# Patient Record
Sex: Male | Born: 1985
Health system: Southern US, Community
[De-identification: ages and names within clinical notes are randomized; demographics above are authoritative.]

## PROBLEM LIST (undated history)

## (undated) DIAGNOSIS — R519 Headache, unspecified: Secondary | ICD-10-CM

## (undated) DIAGNOSIS — F319 Bipolar disorder, unspecified: Secondary | ICD-10-CM

## (undated) DIAGNOSIS — F32A Depression, unspecified: Secondary | ICD-10-CM

## (undated) DIAGNOSIS — R51 Headache: Secondary | ICD-10-CM

## (undated) DIAGNOSIS — F329 Major depressive disorder, single episode, unspecified: Secondary | ICD-10-CM

## (undated) DIAGNOSIS — F419 Anxiety disorder, unspecified: Secondary | ICD-10-CM

## (undated) DIAGNOSIS — Z87442 Personal history of urinary calculi: Secondary | ICD-10-CM

## (undated) HISTORY — PX: OTHER SURGICAL HISTORY: SHX169

## (undated) HISTORY — PX: FRACTURE SURGERY: SHX138

## (undated) HISTORY — PX: WISDOM TOOTH EXTRACTION: SHX21

---

## 2010-07-17 ENCOUNTER — Emergency Department (HOSPITAL_COMMUNITY): Admission: EM | Admit: 2010-07-17 | Discharge: 2010-07-17 | Payer: Self-pay | Admitting: Emergency Medicine

## 2010-08-01 ENCOUNTER — Emergency Department (HOSPITAL_COMMUNITY): Admission: EM | Admit: 2010-08-01 | Discharge: 2010-08-01 | Payer: Self-pay | Admitting: Emergency Medicine

## 2011-02-19 LAB — DIFFERENTIAL
Basophils Absolute: 0 10*3/uL (ref 0.0–0.1)
Basophils Relative: 0 % (ref 0–1)
Eosinophils Absolute: 0.2 10*3/uL (ref 0.0–0.7)
Eosinophils Relative: 1 % (ref 0–5)
Lymphocytes Relative: 14 % (ref 12–46)
Lymphs Abs: 2.1 10*3/uL (ref 0.7–4.0)
Monocytes Absolute: 1.4 10*3/uL — ABNORMAL HIGH (ref 0.1–1.0)
Neutrophils Relative %: 76 % (ref 43–77)

## 2011-02-19 LAB — CBC
HCT: 42.8 % (ref 39.0–52.0)
Hemoglobin: 14.8 g/dL (ref 13.0–17.0)
MCH: 29.3 pg (ref 26.0–34.0)
Platelets: 246 10*3/uL (ref 150–400)
RBC: 5.05 MIL/uL (ref 4.22–5.81)

## 2011-02-19 LAB — POCT I-STAT, CHEM 8
BUN: 12 mg/dL (ref 6–23)
Creatinine, Ser: 0.8 mg/dL (ref 0.4–1.5)
HCT: 46 % (ref 39.0–52.0)
Potassium: 3.9 mEq/L (ref 3.5–5.1)
Sodium: 139 mEq/L (ref 135–145)
TCO2: 27 mmol/L (ref 0–100)

## 2011-04-29 ENCOUNTER — Emergency Department (HOSPITAL_COMMUNITY)
Admission: EM | Admit: 2011-04-29 | Discharge: 2011-04-30 | Disposition: A | Discharge status: Home / Self Care | Payer: Self-pay | Attending: Emergency Medicine | Admitting: Emergency Medicine

## 2011-04-29 DIAGNOSIS — X58XXXA Exposure to other specified factors, initial encounter: Secondary | ICD-10-CM | POA: Insufficient documentation

## 2011-04-29 DIAGNOSIS — L089 Local infection of the skin and subcutaneous tissue, unspecified: Secondary | ICD-10-CM | POA: Insufficient documentation

## 2011-04-29 DIAGNOSIS — IMO0002 Reserved for concepts with insufficient information to code with codable children: Secondary | ICD-10-CM | POA: Insufficient documentation

## 2011-06-24 ENCOUNTER — Emergency Department (HOSPITAL_COMMUNITY): Payer: Self-pay

## 2011-06-24 ENCOUNTER — Emergency Department (HOSPITAL_COMMUNITY)
Admission: EM | Admit: 2011-06-24 | Discharge: 2011-06-24 | Disposition: A | Discharge status: Home / Self Care | Payer: Self-pay | Attending: Emergency Medicine | Admitting: Emergency Medicine

## 2011-06-24 DIAGNOSIS — M25529 Pain in unspecified elbow: Secondary | ICD-10-CM | POA: Insufficient documentation

## 2011-06-24 DIAGNOSIS — M6580 Other synovitis and tenosynovitis, unspecified site: Secondary | ICD-10-CM | POA: Insufficient documentation

## 2011-06-24 DIAGNOSIS — M25429 Effusion, unspecified elbow: Secondary | ICD-10-CM | POA: Insufficient documentation

## 2011-06-24 DIAGNOSIS — R209 Unspecified disturbances of skin sensation: Secondary | ICD-10-CM | POA: Insufficient documentation

## 2014-02-07 ENCOUNTER — Emergency Department (HOSPITAL_COMMUNITY): Payer: 59

## 2014-02-07 ENCOUNTER — Emergency Department (HOSPITAL_COMMUNITY)
Admission: EM | Admit: 2014-02-07 | Discharge: 2014-02-08 | Disposition: A | Discharge status: Home / Self Care | Payer: 59 | Attending: Emergency Medicine | Admitting: Emergency Medicine

## 2014-02-07 ENCOUNTER — Encounter (HOSPITAL_COMMUNITY): Payer: Self-pay | Admitting: Emergency Medicine

## 2014-02-07 DIAGNOSIS — F17200 Nicotine dependence, unspecified, uncomplicated: Secondary | ICD-10-CM | POA: Insufficient documentation

## 2014-02-07 DIAGNOSIS — IMO0002 Reserved for concepts with insufficient information to code with codable children: Secondary | ICD-10-CM | POA: Insufficient documentation

## 2014-02-07 DIAGNOSIS — L0390 Cellulitis, unspecified: Secondary | ICD-10-CM

## 2014-02-07 LAB — CBC WITH DIFFERENTIAL/PLATELET
BASOS ABS: 0 10*3/uL (ref 0.0–0.1)
Basophils Relative: 0 % (ref 0–1)
Eosinophils Absolute: 0.2 10*3/uL (ref 0.0–0.7)
Eosinophils Relative: 3 % (ref 0–5)
HEMATOCRIT: 43.4 % (ref 39.0–52.0)
HEMOGLOBIN: 15.5 g/dL (ref 13.0–17.0)
LYMPHS ABS: 2.8 10*3/uL (ref 0.7–4.0)
LYMPHS PCT: 37 % (ref 12–46)
MCH: 30 pg (ref 26.0–34.0)
MCHC: 35.7 g/dL (ref 30.0–36.0)
MCV: 84.1 fL (ref 78.0–100.0)
MONOS PCT: 9 % (ref 3–12)
Monocytes Absolute: 0.7 10*3/uL (ref 0.1–1.0)
NEUTROS ABS: 3.8 10*3/uL (ref 1.7–7.7)
NEUTROS PCT: 51 % (ref 43–77)
PLATELETS: 242 10*3/uL (ref 150–400)
RBC: 5.16 MIL/uL (ref 4.22–5.81)
RDW: 11.8 % (ref 11.5–15.5)
WBC: 7.4 10*3/uL (ref 4.0–10.5)

## 2014-02-07 MED ORDER — ONDANSETRON HCL 4 MG/2ML IJ SOLN
4.0000 mg | Freq: Once | INTRAMUSCULAR | Status: AC
  Administered 2014-02-08: 4 mg via INTRAVENOUS
  Filled 2014-02-07: qty 2

## 2014-02-07 MED ORDER — MORPHINE SULFATE 4 MG/ML IJ SOLN
4.0000 mg | Freq: Once | INTRAMUSCULAR | Status: AC
  Administered 2014-02-08: 4 mg via INTRAVENOUS
  Filled 2014-02-07: qty 1

## 2014-02-07 NOTE — ED Provider Notes (Signed)
CSN: [id number]     Arrival date & time 02/07/14  2114 History  This chart was scribed for non-physician practitioner Emilia BeckKaitlyn Jenna Routzahn, PA-C working with Layla MawKristen N Ward, DO by Joaquin MusicKristina Sanchez-Matthews, ED Scribe. This patient was seen in room TR08C/TR08C and the patient's care was started at 11:34 PM .   Chief Complaint  Patient presents with  . Wrist Pain   The history is provided by the patient. No language interpreter was used.   HPI Comments: Tyler Robertson is a 28 y.o. male who presents to the Emergency Department complaining of ongoing R wrist pain that worsened this evening at work. 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 Pt states he has pain with movement and states R wrist has been swelling. He states he had an infection to R arm 3 years ago and states he had emergency surgery done where antibiotic beads were placed in his humerus. Pt states [facility]. Pt denies any recent injuries.  History reviewed. No pertinent past medical history. Past Surgical History  Procedure Laterality Date  . Fracture surgery     History reviewed. No pertinent family history. History  Substance Use Topics  . Smoking status: Never Smoker   . Smokeless tobacco: Current User    Types: Chew  . Alcohol Use: No    Review of Systems  Musculoskeletal: Positive for joint swelling.  Skin: Positive for color change.  All other systems reviewed and are negative.   Allergies  Review of patient's allergies indicates no known allergies.  Home Medications  No current outpatient prescriptions on file.  BP 150/94  Pulse 80  Temp(Src) 98.3 F (36.8 C) (Oral)  Resp 16  Wt 184 lb 5 oz (83.604 kg)  SpO2 98%  Physical Exam  Nursing note and vitals reviewed. Constitutional: He is oriented to person, place, and time. He appears well-developed and well-nourished. No distress.  HENT:  Head: Normocephalic  and atraumatic.  Eyes: EOM are normal.  Neck: Neck supple. No tracheal deviation present.  Cardiovascular: Normal rate.   Pulmonary/Chest: Effort normal. No respiratory distress.  Musculoskeletal:  Right wrist limited ROM due to pain and swelling. No obvious deformity.   Neurological: He is alert and oriented to person, place, and time.  Skin: Skin is warm and dry.  Erythema and edema noted to dorsal right wrist. No open wound.   Psychiatric: He has a normal mood and affect. His behavior is normal.    ED Course  Procedures  DIAGNOSTIC STUDIES: Oxygen Saturation is 98% on RA, normal by my interpretation.    COORDINATION OF CARE: 11:40 PM-Discussed treatment plan which includes wait for radiology readings and order labs. Pt agreed to plan.   Labs Review Labs Reviewed  CBC WITH DIFFERENTIAL  BASIC METABOLIC PANEL   Imaging Review Dg Wrist Complete Right  02/07/2014   CLINICAL DATA:  Wrist pain with cellulitis. Evaluate for osteomyelitis  EXAM: RIGHT WRIST - COMPLETE 3+ VIEW  COMPARISON:  None.  FINDINGS: There is postsurgical or posttraumatic deformity of the forearm. There are no areas of bone erosion or focal osteopenia within the wrist. The carpal joints are symmetric throughout. No evidence of fracture.  IMPRESSION: No evidence of wrist osteomyelitis.   Electronically Signed   By: Tiburcio PeaJonathan  Watts M.D.   On: 02/07/2014 23:41     EKG Interpretation None     MDM  Final diagnoses:  Cellulitis    1:49 AM Labs and xray unremarkable for acute changes. Patient likely has cellulitis. Patient received IV clindamycin here. Patient will be discharged with Keflex and Bactrim and Vicodin for pain. Patient advised to return with worsening or concerning symptoms. Vitals stable and patient afebrile.   I personally performed the services described in this documentation, which was scribed in my presence. The recorded information has been reviewed and is accurate.    Emilia Beck,  [place] 02/08/14 [phone]

## 2014-02-07 NOTE — ED Notes (Signed)
Presents with right wrist pain and induration. Reports decrease in strength in hand and pain in wrist and hand.  HX of multiple fractures and surgeries in left arm.  Not warm to touch, soft, CMS intact. Denies injury.

## 2014-02-08 LAB — BASIC METABOLIC PANEL
BUN: 12 mg/dL (ref 6–23)
CO2: 25 meq/L (ref 19–32)
Calcium: 9.4 mg/dL (ref 8.4–10.5)
Chloride: 102 mEq/L (ref 96–112)
Creatinine, Ser: 0.96 mg/dL (ref 0.50–1.35)
GFR calc Af Amer: >90 mL/min (ref >90)
GFR calc non Af Amer: >90 mL/min (ref >90)
GLUCOSE: 89 mg/dL (ref 70–99)
POTASSIUM: 4.2 meq/L (ref 3.7–5.3)
Sodium: 140 mEq/L (ref 137–147)

## 2014-02-08 MED ORDER — HYDROCODONE-ACETAMINOPHEN 5-325 MG PO TABS: 2.0000 | ORAL_TABLET | ORAL | Status: DC | PRN

## 2014-02-08 MED ORDER — CEPHALEXIN 500 MG PO CAPS: 500.0000 mg | ORAL_CAPSULE | Freq: Four times a day (QID) | ORAL | Status: DC

## 2014-02-08 MED ORDER — CLINDAMYCIN PHOSPHATE 900 MG/50ML IV SOLN
900.0000 mg | Freq: Once | INTRAVENOUS | Status: AC
  Administered 2014-02-08: 900 mg via INTRAVENOUS
  Filled 2014-02-08: qty 50

## 2014-02-08 MED ORDER — SULFAMETHOXAZOLE-TRIMETHOPRIM 800-160 MG PO TABS: 1.0000 | ORAL_TABLET | Freq: Two times a day (BID) | ORAL | Status: DC

## 2014-02-08 NOTE — ED Notes (Signed)
Family at bedside, patient comfortable.

## 2014-02-08 NOTE — ED Notes (Signed)
Patient is alert and orientedx4.  Patient was explained discharge instructions and they understood them with no questions.  The patient's wife, Letta KocherRenee Tay is taking the patient home.

## 2014-02-08 NOTE — Discharge Instructions (Signed)
Take Keflex and Bactrim as directed until gone. Take Vicodin as needed for pain. Refer to attached documents for more information. Return to the ED with worsening or concerning symptoms.

## 2014-02-08 NOTE — ED Provider Notes (Signed)
Medical screening examination/treatment/procedure(s) were performed by non-physician practitioner and as supervising physician I was immediately available for consultation/collaboration.   EKG Interpretation None        Reon Hunley, MD 02/08/14 0607 

## 2014-02-08 NOTE — ED Notes (Signed)
Family at bedside. 

## 2014-03-20 ENCOUNTER — Encounter (HOSPITAL_COMMUNITY): Payer: Self-pay | Admitting: Emergency Medicine

## 2014-03-20 ENCOUNTER — Emergency Department (HOSPITAL_COMMUNITY)
Admission: EM | Admit: 2014-03-20 | Discharge: 2014-03-20 | Disposition: A | Discharge status: Home / Self Care | Payer: 59 | Attending: Emergency Medicine | Admitting: Emergency Medicine

## 2014-03-20 ENCOUNTER — Emergency Department (HOSPITAL_COMMUNITY): Payer: 59

## 2014-03-20 DIAGNOSIS — R51 Headache: Secondary | ICD-10-CM | POA: Insufficient documentation

## 2014-03-20 DIAGNOSIS — R519 Headache, unspecified: Secondary | ICD-10-CM

## 2014-03-20 MED ORDER — METOCLOPRAMIDE HCL 5 MG/ML IJ SOLN
10.0000 mg | Freq: Once | INTRAMUSCULAR | Status: AC
  Administered 2014-03-20: 10 mg via INTRAVENOUS
  Filled 2014-03-20: qty 2

## 2014-03-20 MED ORDER — DIPHENHYDRAMINE HCL 50 MG/ML IJ SOLN
25.0000 mg | Freq: Once | INTRAMUSCULAR | Status: AC
  Administered 2014-03-20: 25 mg via INTRAVENOUS
  Filled 2014-03-20: qty 1

## 2014-03-20 MED ORDER — DEXAMETHASONE SODIUM PHOSPHATE 4 MG/ML IJ SOLN
8.0000 mg | Freq: Once | INTRAMUSCULAR | Status: AC
  Administered 2014-03-20: 8 mg via INTRAVENOUS
  Filled 2014-03-20: qty 2

## 2014-03-20 MED ORDER — SODIUM CHLORIDE 0.9 % IV BOLUS (SEPSIS)
1000.0000 mL | Freq: Once | INTRAVENOUS | Status: AC
  Administered 2014-03-20: 1000 mL via INTRAVENOUS

## 2014-03-20 NOTE — Discharge Instructions (Signed)
Sinus Headache °A sinus headache is when your sinuses become clogged or swollen. Sinus headaches can range from mild to severe.  °CAUSES °A sinus headache can have different causes, such as: °· Colds. °· Sinus infections. °· Allergies. °SYMPTOMS  °Symptoms of a sinus headache may vary and can include: °· Headache. °· Pain or pressure in the face. °· Congested or runny nose. °· Fever. °· Inability to smell. °· Pain in upper teeth. °Weather changes can make symptoms worse. °TREATMENT  °The treatment of a sinus headache depends on the cause. °· Sinus pain caused by a sinus infection may be treated with antibiotic medicine. °· Sinus pain caused by allergies may be helped by allergy medicines (antihistamines) and medicated nasal sprays. °· Sinus pain caused by congestion may be helped by flushing the nose and sinuses with saline solution. °HOME CARE INSTRUCTIONS  °· If antibiotics are prescribed, take them as directed. Finish them even if you start to feel better. °· Only take over-the-counter or prescription medicines for pain, discomfort, or fever as directed by your caregiver. °· If you have congestion, use a nasal spray to help reduce pressure. °SEEK IMMEDIATE MEDICAL CARE IF: °· You have a fever. °· You have headaches more than once a week. °· You have sensitivity to light or sound. °· You have repeated nausea and vomiting. °· You have vision problems. °· You have sudden, severe pain in your face or head. °· You have a seizure. °· You are confused. °· Your sinus headaches do not get better after treatment. Many people think they have a sinus headache when they actually have migraines or tension headaches. °MAKE SURE YOU:  °· Understand these instructions. °· Will watch your condition. °· Will get help right away if you are not doing well or get worse. °Document Released: 12/30/2004 Document Revised: 02/14/2012 Document Reviewed: 02/20/2011 °Exit[employer]® Patient Information ©2014 [employer]. ° °

## 2014-03-20 NOTE — ED Notes (Signed)
Pt states he has been having headaches off and on for over 3 months, wife was rubbing his head last night and felt a lump on the back of his head last night. Pt denies any injury or fall.

## 2014-03-20 NOTE — ED Notes (Signed)
Patient transported to CT 

## 2014-03-20 NOTE — ED Notes (Signed)
MD at bedside. 

## 2014-03-20 NOTE — ED Provider Notes (Signed)
CSN: [id number]     Arrival date & time 03/20/14  1412 History   First MD Initiated Contact with Patient 03/20/14 1606     Chief Complaint  Patient presents with  . Headache     (Consider location/radiation/quality/duration/timing/severity/associated sxs/prior Treatment) HPI  This is a 28 y.o. male with no pertinent PMH presenting today with HA.  Onset about 2 months ago.  Located diffusely.  Waxes and wanes.  Occurs about 1-2 times a day.  Severe.  Throbbing.  No alleviation with tylenol, vicodin.  Aggravated by loud noises, bright lights.  Radiates throughout head.  Negative for fever, trauma, bleeding.  Pt is UTD on vaccines.  Positive for lump in the right occipital head which was noticed last night.    History reviewed. No pertinent past medical history. Past Surgical History  Procedure Laterality Date  . Fracture surgery     No family history on file. History  Substance Use Topics  . Smoking status: Never Smoker   . Smokeless tobacco: Current User    Types: Chew  . Alcohol Use: No    Review of Systems  Constitutional: Negative for fever and chills.  HENT: Negative for facial swelling.   Eyes: Negative for pain and visual disturbance.  Respiratory: Negative for chest tightness and shortness of breath.   Cardiovascular: Negative for chest pain.  Gastrointestinal: Negative for nausea and vomiting.  Genitourinary: Negative for dysuria.  Musculoskeletal: Negative for arthralgias and myalgias.  Neurological: Positive for headaches.  Psychiatric/Behavioral: Negative for behavioral problems.      Allergies  Review of patient's allergies indicates no known allergies.  Home Medications   Prior to Admission medications   Medication Sig Start Date End Date Taking? Authorizing Provider  cephALEXin (KEFLEX) 500 MG capsule Take 1 capsule (500 mg total) by mouth 4 (four) times daily. 02/08/14   Emilia BeckKaitlyn Szekalski, PA-C  HYDROcodone-acetaminophen (NORCO/VICODIN) 5-325 MG per  tablet Take 2 tablets by mouth every 4 (four) hours as needed. 02/08/14   Kaitlyn Szekalski, PA-C  hydrocodone-ibuprofen (VICOPROFEN) 5-200 MG per tablet Take 1 tablet by mouth every 8 (eight) hours as needed for pain.    Historical Provider, MD  sulfamethoxazole-trimethoprim (SEPTRA DS) 800-160 MG per tablet Take 1 tablet by mouth every 12 (twelve) hours. 02/08/14   Kaitlyn Szekalski, PA-C   BP 146/97  Pulse 61  Temp(Src) 97.9 F (36.6 C) (Oral)  Ht 5\' 3"  (1.6 m)  Wt 184 lb 14.4 oz (83.87 kg)  BMI 32.76 kg/m2  SpO2 98% Physical Exam  Constitutional: He is oriented to person, place, and time. He appears well-developed and well-nourished. No distress.  HENT:  Head: Normocephalic and atraumatic.  Mouth/Throat: No oropharyngeal exudate.  Eyes: Conjunctivae are normal. Pupils are equal, round, and reactive to light. No scleral icterus.  Neck: Normal range of motion. No tracheal deviation present. No thyromegaly present.  Cardiovascular: Normal rate, regular rhythm and normal heart sounds.  Exam reveals no gallop and no friction rub.   No murmur heard. Pulmonary/Chest: Effort normal and breath sounds normal. No stridor. No respiratory distress. He has no wheezes. He has no rales. He exhibits no tenderness.  Abdominal: Soft. He exhibits no distension and no mass. There is no tenderness. There is no rebound and no guarding.  Musculoskeletal: Normal range of motion. He exhibits no edema.  Neurological: He is alert and oriented to person, place, and time. He has normal strength. No cranial nerve deficit or sensory deficit. He displays a negative Romberg sign. Coordination and gait  normal. GCS eye subscore is 4. GCS verbal subscore is 5. GCS motor subscore is 6.  Reflex Scores:      Patellar reflexes are 2+ on the right side and 2+ on the left side. Skin: Skin is warm and dry. He is not diaphoretic.    ED Course  Procedures (including critical care time) Labs Review Labs Reviewed - No data to  display  Imaging Review No results found.   EKG Interpretation None      MDM   Final diagnoses:  None    This is a 28 y.o. male with no pertinent PMH presenting today with HA.  Onset about 2 months ago.  Located diffusely.  Waxes and wanes.  Occurs about 1-2 times a day.  Severe.  Throbbing.  No alleviation with tylenol, vicodin.  Aggravated by loud noises, bright lights.  Radiates throughout head.  Negative for fever, trauma, bleeding.  Pt is UTD on vaccines.  Positive for lump in the right occipital head which was noticed last night.    Exam reveals no neuro deficits.  I do appreciate right occipital lump.    Considering age, concern for lump in the right occipital area, presentation of severe headache for 2 months, I ordered CT scan of the patient's head. I have ordered a bolus of normal saline, Reglan, Benadryl, Decadron. Will followup results and for a CT scan and monitor closely, reevaluate.  CT scan reveals no acute intracranial abnormality. Patient's symptoms have resolved with medication. At this time, I doubt meningitis, thrombosis, intracranial hemorrhage, cluster headache, glaucoma, or any other concerning etiology. He has no symptoms of stroke.  Currently asymptomatic.  Pt stable for discharge, FU.  All questions answered.  Return precautions given.  I have discussed case and care has been guided by my attending physician, Dr. Juleen ChinaKohut.  Loma BostonStirling Fiore Detjen, MD 03/21/14 [phone]

## 2014-03-25 NOTE — ED Provider Notes (Signed)
I saw and evaluated the patient, reviewed the resident's note and I agree with the findings and plan.   EKG Interpretation None      27yM with HA. Suspect primary HA. Consider emergent secondary causes such as bleed, infectious or mass but doubt. There is no history of trauma. Pt has a nonfocal neurological exam. Afebrile and neck supple. No use of blood thinning medication. Consider ocular etiology such as acute angle closure glaucoma but doubt. Pt denies acute change in visual acuity and eye exam unremarkable.  Doubt CO poisoning. No contacts with similar symptoms. Doubt venous thrombosis. Doubt carotid or vertebral arteries dissection. Symptoms improved with meds. No significant "red flags" identified aside from duration of HA, and because of this, CT done. Neg.  Feel that can be safely discharged, but strict return precautions discussed. Outpt fu.   Raeford RazorStephen Rhyder Koegel, MD 03/25/14 [phone]

## 2014-09-23 ENCOUNTER — Emergency Department (HOSPITAL_COMMUNITY)
Admission: EM | Admit: 2014-09-23 | Discharge: 2014-09-24 | Disposition: A | Discharge status: Home / Self Care | Payer: 59 | Attending: Emergency Medicine | Admitting: Emergency Medicine

## 2014-09-23 ENCOUNTER — Emergency Department (HOSPITAL_COMMUNITY): Payer: 59

## 2014-09-23 ENCOUNTER — Encounter (HOSPITAL_COMMUNITY): Payer: Self-pay | Admitting: Emergency Medicine

## 2014-09-23 DIAGNOSIS — R109 Unspecified abdominal pain: Secondary | ICD-10-CM

## 2014-09-23 DIAGNOSIS — N390 Urinary tract infection, site not specified: Secondary | ICD-10-CM

## 2014-09-23 LAB — COMPREHENSIVE METABOLIC PANEL
ALT: 44 U/L (ref 0–53)
AST: 42 U/L — ABNORMAL HIGH (ref 0–37)
Albumin: 4.2 g/dL (ref 3.5–5.2)
Alkaline Phosphatase: 76 U/L (ref 39–117)
Anion gap: 16 — ABNORMAL HIGH (ref 5–15)
BILIRUBIN TOTAL: 0.5 mg/dL (ref 0.3–1.2)
BUN: 11 mg/dL (ref 6–23)
CO2: 22 mEq/L (ref 19–32)
CREATININE: 1.05 mg/dL (ref 0.50–1.35)
Calcium: 9.4 mg/dL (ref 8.4–10.5)
Chloride: 94 mEq/L — ABNORMAL LOW (ref 96–112)
GLUCOSE: 106 mg/dL — AB (ref 70–99)
Potassium: 3.9 mEq/L (ref 3.7–5.3)
SODIUM: 132 meq/L — AB (ref 137–147)
TOTAL PROTEIN: 8.2 g/dL (ref 6.0–8.3)

## 2014-09-23 LAB — CBC WITH DIFFERENTIAL/PLATELET
Basophils Absolute: 0 10*3/uL (ref 0.0–0.1)
Basophils Relative: 0 % (ref 0–1)
EOS ABS: 0 10*3/uL (ref 0.0–0.7)
Eosinophils Relative: 0 % (ref 0–5)
HCT: 44.2 % (ref 39.0–52.0)
Hemoglobin: 15.6 g/dL (ref 13.0–17.0)
LYMPHS ABS: 0.8 10*3/uL (ref 0.7–4.0)
LYMPHS PCT: 7 % — AB (ref 12–46)
MCH: 29.5 pg (ref 26.0–34.0)
MCHC: 35.3 g/dL (ref 30.0–36.0)
MCV: 83.7 fL (ref 78.0–100.0)
MONO ABS: 0.7 10*3/uL (ref 0.1–1.0)
MONOS PCT: 7 % (ref 3–12)
NEUTROS ABS: 9.4 10*3/uL — AB (ref 1.7–7.7)
Neutrophils Relative %: 86 % — ABNORMAL HIGH (ref 43–77)
PLATELETS: 201 10*3/uL (ref 150–400)
RBC: 5.28 MIL/uL (ref 4.22–5.81)
RDW: 12 % (ref 11.5–15.5)
WBC: 10.9 10*3/uL — ABNORMAL HIGH (ref 4.0–10.5)

## 2014-09-23 LAB — URINE MICROSCOPIC-ADD ON

## 2014-09-23 LAB — URINALYSIS, ROUTINE W REFLEX MICROSCOPIC
BILIRUBIN URINE: NEGATIVE
GLUCOSE, UA: NEGATIVE mg/dL
KETONES UR: NEGATIVE mg/dL
Nitrite: NEGATIVE
PH: 6.5 (ref 5.0–8.0)
Protein, ur: NEGATIVE mg/dL
SPECIFIC GRAVITY, URINE: 1.014 (ref 1.005–1.030)
UROBILINOGEN UA: 0.2 mg/dL (ref 0.0–1.0)

## 2014-09-23 MED ORDER — OXYCODONE-ACETAMINOPHEN 5-325 MG PO TABS
1.0000 | ORAL_TABLET | Freq: Once | ORAL | Status: AC
  Administered 2014-09-23: 1 via ORAL
  Filled 2014-09-23: qty 1

## 2014-09-23 MED ORDER — SODIUM CHLORIDE 0.9 % IV BOLUS (SEPSIS)
1000.0000 mL | Freq: Once | INTRAVENOUS | Status: AC
  Administered 2014-09-23: 1000 mL via INTRAVENOUS

## 2014-09-23 MED ORDER — HYDROMORPHONE HCL 1 MG/ML IJ SOLN
1.0000 mg | Freq: Once | INTRAMUSCULAR | Status: AC
  Administered 2014-09-23: 1 mg via INTRAVENOUS
  Filled 2014-09-23: qty 1

## 2014-09-23 NOTE — ED Notes (Signed)
Pt in c/o left flank pain that started this morning, symptoms have gotten progressively worse, no history of kidney stones, pt had a fever PTA and took tylenol, reports urinary frequency and states he is having trouble emptying his bladder

## 2014-09-23 NOTE — ED Notes (Signed)
Pt to CT at this time.

## 2014-09-24 MED ORDER — KETOROLAC TROMETHAMINE 30 MG/ML IJ SOLN
30.0000 mg | Freq: Once | INTRAMUSCULAR | Status: AC
  Administered 2014-09-24: 30 mg via INTRAVENOUS
  Filled 2014-09-24: qty 1

## 2014-09-24 MED ORDER — HYDROCODONE-ACETAMINOPHEN 5-325 MG PO TABS: 1.0000 | ORAL_TABLET | Freq: Four times a day (QID) | ORAL | Status: DC | PRN

## 2014-09-24 MED ORDER — CEPHALEXIN 500 MG PO CAPS: 1000.0000 mg | ORAL_CAPSULE | Freq: Two times a day (BID) | ORAL | Status: DC

## 2014-09-24 MED ORDER — DEXTROSE 5 % IV SOLN
1.0000 g | Freq: Once | INTRAVENOUS | Status: AC
  Administered 2014-09-24: 1 g via INTRAVENOUS
  Filled 2014-09-24: qty 10

## 2014-09-24 MED ORDER — PROMETHAZINE HCL 25 MG PO TABS: 25.0000 mg | ORAL_TABLET | Freq: Three times a day (TID) | ORAL | Status: DC | PRN

## 2014-09-24 NOTE — ED Provider Notes (Signed)
Medical screening examination/treatment/procedure(s) were performed by non-physician practitioner and as supervising physician I was immediately available for consultation/collaboration.   EKG Interpretation None       Juliet RudeNathan R. Rubin PayorPickering, MD 09/24/14 [phone]

## 2014-09-24 NOTE — ED Provider Notes (Signed)
CSN: [id number]     Arrival date & time 09/23/14  1837 History   First MD Initiated Contact with Patient 09/23/14 2105     Chief Complaint  Patient presents with  . Flank Pain     (Consider location/radiation/quality/duration/timing/severity/associated sxs/prior Treatment) HPI Patient presents to the emergency department with left flank pain that started this morning.  The patient, states, that the symptoms get worse over the day.  The patient, states, that he's had some urinary frequency and trouble with fully emptying his bladder.  Patient, states pain radiates from his flank down to his left groin.  Patient denies a history of kidney stone.  The patient, states she seen blood in his urine.  Patient sent here by an urgent care center for further evaluation and care.  Patient, states, that nothing seems to make his condition, better or worse.  The patient did not take any medications prior to arrival.  Patient, states, that he doesn't have any chest pain, shortness of breath, headache, blurred vision, weakness, numbness, dizziness, back pain, neck pain, nausea, vomiting, or diarrhea.  Patient also denies syncope, or rash.  The patient, states, that he has had chills throughout the day. History reviewed. No pertinent past medical history. Past Surgical History  Procedure Laterality Date  . Fracture surgery     History reviewed. No pertinent family history. History  Substance Use Topics  . Smoking status: Never Smoker   . Smokeless tobacco: Current User    Types: Chew  . Alcohol Use: No    Review of Systems All other systems negative except as documented in the HPI. All pertinent positives and negatives as reviewed in the HPI.   Allergies  Review of patient's allergies indicates no known allergies.  Home Medications   Prior to Admission medications   Medication Sig Start Date End Date Taking? Authorizing Provider  Phenylephrine-DM-GG-APAP (TYLENOL COLD/FLU SEVERE) 5-10-200-325 MG  TABS Take 1 tablet by mouth daily as needed (minor pain).   Yes Historical Provider, MD  cephALEXin (KEFLEX) 500 MG capsule Take 2 capsules (1,000 mg total) by mouth 2 (two) times daily. 09/24/14   Carlyle Dollyhristopher W Jody Silas, PA-C  HYDROcodone-acetaminophen (NORCO/VICODIN) 5-325 MG per tablet Take 1 tablet by mouth every 6 (six) hours as needed for moderate pain. 09/24/14   Jamesetta Orleanshristopher W Rashaunda Rahl, PA-C  promethazine (PHENERGAN) 25 MG tablet Take 1 tablet (25 mg total) by mouth every 8 (eight) hours as needed for nausea or vomiting. 09/24/14   Jamesetta Orleanshristopher W Alston Berrie, PA-C   BP 120/73  Pulse 84  Temp(Src) 99.5 F (37.5 C) (Oral)  Resp 20  SpO2 94% Physical Exam  Nursing note and vitals reviewed. Constitutional: He is oriented to person, place, and time. He appears well-developed and well-nourished. No distress.  HENT:  Head: Normocephalic and atraumatic.  Mouth/Throat: Oropharynx is clear and moist.  Eyes: Pupils are equal, round, and reactive to light.  Neck: Normal range of motion. Neck supple.  Cardiovascular: Normal rate, regular rhythm and normal heart sounds.  Exam reveals no gallop and no friction rub.   No murmur heard. Pulmonary/Chest: Effort normal and breath sounds normal. No respiratory distress.  Abdominal: Soft. Bowel sounds are normal. He exhibits no distension. There is no tenderness.  Neurological: He is alert and oriented to person, place, and time. He exhibits normal muscle tone. Coordination normal.  Skin: Skin is warm and dry. No rash noted. No erythema.    ED Course  Procedures (including critical care time) Labs Review Labs Reviewed  URINALYSIS, ROUTINE W REFLEX MICROSCOPIC - Abnormal; Notable for the following:    APPearance CLOUDY (*)    Hgb urine dipstick LARGE (*)    Leukocytes, UA LARGE (*)    All other components within normal limits  CBC WITH DIFFERENTIAL - Abnormal; Notable for the following:    WBC 10.9 (*)    Neutrophils Relative % 86 (*)    Neutro Abs 9.4  (*)    Lymphocytes Relative 7 (*)    All other components within normal limits  COMPREHENSIVE METABOLIC PANEL - Abnormal; Notable for the following:    Sodium 132 (*)    Chloride 94 (*)    Glucose, Bld 106 (*)    AST 42 (*)    Anion gap 16 (*)    All other components within normal limits  URINE MICROSCOPIC-ADD ON - Abnormal; Notable for the following:    Bacteria, UA FEW (*)    All other components within normal limits  URINE CULTURE    Imaging Review Ct Abdomen Pelvis Wo Contrast  09/23/2014   CLINICAL DATA:  Acute left flank pain.  EXAM: CT ABDOMEN AND PELVIS WITHOUT CONTRAST  TECHNIQUE: Multidetector CT imaging of the abdomen and pelvis was performed following the standard protocol without IV contrast.  COMPARISON:  None.  FINDINGS: Visualized lung bases appear normal. No significant osseous abnormality is noted.  No gallstones are noted. No focal abnormality is noted in the liver, spleen or pancreas on these unenhanced images. Adrenal glands appear normal. No hydronephrosis or renal obstruction is noted. No renal or ureteral calculi are noted. The appendix appears normal. There is no evidence of bowel obstruction. No abnormal fluid collection is noted. Urinary bladder appears normal. No significant adenopathy is noted. Small fat containing right inguinal hernia is noted.  IMPRESSION: Small fat containing right inguinal hernia.  No hydronephrosis or renal obstruction is noted. No renal or ureteral calculi are noted.   Electronically Signed   By: Roque LiasJames  Green M.D.   On: 09/23/2014 22:10      MDM   Final diagnoses:  Flank pain  UTI (lower urinary tract infection)   She'll be treated for possible UTI with his symptoms and low-grade fever.  Patient is given antibiotics.  Told to return here for any worsening in his condition.  He'll followup with urology as they're still possibility for undetected stone versus a kidney stone that has passed.  Patient has received IV fluids and is stable  here in the emergency room     Carlyle DollyChristopher W Alitzel Cookson, PA-C 09/24/14 [phone]

## 2014-09-24 NOTE — Discharge Instructions (Signed)
Return here for any worsening in your condition.  Followup with the urologist provided.  Increase your fluid intake, Tylenol and Motrin for any fever

## 2014-09-26 LAB — URINE CULTURE: Colony Count: 75000

## 2014-09-28 ENCOUNTER — Telehealth (HOSPITAL_BASED_OUTPATIENT_CLINIC_OR_DEPARTMENT_OTHER): Payer: Self-pay | Admitting: Emergency Medicine

## 2014-09-28 NOTE — Telephone Encounter (Signed)
Post ED Visit - Positive Culture Follow-up  Culture report reviewed by antimicrobial stewardship pharmacist: [x]  Wes Dulaney, Pharm.D., BCPS []  Celedonio MiyamotoJeremy Frens, Pharm.D., BCPS []  Georgina PillionElizabeth Martin, [address] BCPS []  [place] CityMinh Pham, [address]Ph[address] BCPS, AAHIVP []  Estella HuskMichelle Turner, Pharm.D., BCPS, AAHIVP []  Carly Sabat, Pharm.D. []  Enzo BiNathan Batchelder, [address]  Positive urine culture Treated with Keflex, organism sensitive to the same and no further patient follow-up is required at this time.  Marcelle OverlieHolland, Jenel LucksKylie 09/28/2014, 11:45 AM

## 2015-04-16 ENCOUNTER — Emergency Department (HOSPITAL_COMMUNITY)
Admission: EM | Admit: 2015-04-16 | Discharge: 2015-04-16 | Disposition: A | Discharge status: Home / Self Care | Payer: 59 | Attending: Emergency Medicine | Admitting: Emergency Medicine

## 2015-04-16 ENCOUNTER — Encounter (HOSPITAL_COMMUNITY): Payer: Self-pay | Admitting: *Deleted

## 2015-04-16 DIAGNOSIS — R1030 Lower abdominal pain, unspecified: Secondary | ICD-10-CM | POA: Diagnosis present

## 2015-04-16 DIAGNOSIS — N508 Other specified disorders of male genital organs: Secondary | ICD-10-CM | POA: Insufficient documentation

## 2015-04-16 DIAGNOSIS — L02214 Cutaneous abscess of groin: Secondary | ICD-10-CM | POA: Diagnosis not present

## 2015-04-16 LAB — COMPREHENSIVE METABOLIC PANEL
ALT: 22 U/L (ref 17–63)
AST: 23 U/L (ref 15–41)
Albumin: 3.9 g/dL (ref 3.5–5.0)
Alkaline Phosphatase: 68 U/L (ref 38–126)
Anion gap: 9 (ref 5–15)
BUN: 13 mg/dL (ref 6–20)
CO2: 24 mmol/L (ref 22–32)
Calcium: 9.2 mg/dL (ref 8.9–10.3)
Chloride: 103 mmol/L (ref 101–111)
Creatinine, Ser: 1.12 mg/dL (ref 0.61–1.24)
GFR calc Af Amer: >60 mL/min (ref >60)
GFR calc non Af Amer: >60 mL/min (ref >60)
Glucose, Bld: 113 mg/dL — ABNORMAL HIGH (ref 70–99)
Potassium: 3.8 mmol/L (ref 3.5–5.1)
Sodium: 136 mmol/L (ref 135–145)
Total Bilirubin: 0.3 mg/dL (ref 0.3–1.2)
Total Protein: 7.1 g/dL (ref 6.5–8.1)

## 2015-04-16 LAB — CBC WITH DIFFERENTIAL/PLATELET
Basophils Absolute: 0 10*3/uL (ref 0.0–0.1)
Basophils Relative: 0 % (ref 0–1)
Eosinophils Absolute: 0.7 10*3/uL (ref 0.0–0.7)
Eosinophils Relative: 7 % — ABNORMAL HIGH (ref 0–5)
HCT: 41.6 % (ref 39.0–52.0)
Hemoglobin: 14.2 g/dL (ref 13.0–17.0)
Lymphocytes Relative: 23 % (ref 12–46)
Lymphs Abs: 2.2 10*3/uL (ref 0.7–4.0)
MCH: 28.6 pg (ref 26.0–34.0)
MCHC: 34.1 g/dL (ref 30.0–36.0)
MCV: 83.9 fL (ref 78.0–100.0)
Monocytes Absolute: 0.7 10*3/uL (ref 0.1–1.0)
Monocytes Relative: 7 % (ref 3–12)
Neutro Abs: 6 10*3/uL (ref 1.7–7.7)
Neutrophils Relative %: 63 % (ref 43–77)
Platelets: 238 10*3/uL (ref 150–400)
RBC: 4.96 MIL/uL (ref 4.22–5.81)
RDW: 12.3 % (ref 11.5–15.5)
WBC: 9.6 10*3/uL (ref 4.0–10.5)

## 2015-04-16 MED ORDER — LIDOCAINE-EPINEPHRINE (PF) 2 %-1:200000 IJ SOLN
10.0000 mL | Freq: Once | INTRAMUSCULAR | Status: AC
  Administered 2015-04-16: 10 mL via INTRADERMAL
  Filled 2015-04-16: qty 20

## 2015-04-16 MED ORDER — HYDROCODONE-ACETAMINOPHEN 5-325 MG PO TABS
1.0000 | ORAL_TABLET | Freq: Four times a day (QID) | ORAL | Status: DC | PRN
Start: 2015-04-16 — End: 2015-07-28

## 2015-04-16 MED ORDER — CEPHALEXIN 500 MG PO CAPS: 1000.0000 mg | ORAL_CAPSULE | Freq: Two times a day (BID) | ORAL | Status: DC

## 2015-04-16 MED ORDER — SULFAMETHOXAZOLE-TRIMETHOPRIM 800-160 MG PO TABS: 1.0000 | ORAL_TABLET | Freq: Two times a day (BID) | ORAL | Status: AC

## 2015-04-16 MED ORDER — OXYCODONE-ACETAMINOPHEN 5-325 MG PO TABS
1.0000 | ORAL_TABLET | Freq: Once | ORAL | Status: AC
  Administered 2015-04-16: 1 via ORAL
  Filled 2015-04-16: qty 1

## 2015-04-16 NOTE — Discharge Instructions (Signed)
Abscess °Care After °An abscess (also called a boil or furuncle) is an infected area that contains a collection of pus. Signs and symptoms of an abscess include pain, tenderness, redness, or hardness, or you may feel a moveable soft area under your skin. An abscess can occur anywhere in the body. The infection may spread to surrounding tissues causing cellulitis. A cut (incision) by the surgeon was made over your abscess and the pus was drained out. Gauze may have been packed into the space to provide a drain that will allow the cavity to heal from the inside outwards. The boil may be painful for 5 to 7 days. Most people with a boil do not have high fevers. Your abscess, if seen early, may not have localized, and may not have been lanced. If not, another appointment may be required for this if it does not get better on its own or with medications. °HOME CARE INSTRUCTIONS  °· Only take over-the-counter or prescription medicines for pain, discomfort, or fever as directed by your caregiver. °· When you bathe, soak and then remove gauze or iodoform packs at least daily or as directed by your caregiver. You may then wash the wound gently with mild soapy water. Repack with gauze or do as your caregiver directs. °SEEK IMMEDIATE MEDICAL CARE IF:  °· You develop increased pain, swelling, redness, drainage, or bleeding in the wound site. °· You develop signs of generalized infection including muscle aches, chills, fever, or a general ill feeling. °· An oral temperature above 102° F (38.9° C) develops, not controlled by medication. °See your caregiver for a recheck if you develop any of the symptoms described above. If medications (antibiotics) were prescribed, take them as directed. °Document Released: 06/10/2005 Document Revised: 02/14/2012 Document Reviewed: 02/05/2008 °[employer]® Patient Information ©2015 [employer]. This information is not intended to replace advice given to you by your health care provider. Make sure  you discuss any questions you have with your health care provider. ° °Cellulitis °Cellulitis is an infection of the skin and the tissue beneath it. The infected area is usually red and tender. Cellulitis occurs most often in the arms and lower legs.  °CAUSES  °Cellulitis is caused by bacteria that enter the skin through cracks or cuts in the skin. The most common types of bacteria that cause cellulitis are staphylococci and streptococci. °SIGNS AND SYMPTOMS  °· Redness and warmth. °· Swelling. °· Tenderness or pain. °· Fever. °DIAGNOSIS  °Your health care provider can usually determine what is wrong based on a physical exam. Blood tests may also be done. °TREATMENT  °Treatment usually involves taking an antibiotic medicine. °HOME CARE INSTRUCTIONS  °· Take your antibiotic medicine as directed by your health care provider. Finish the antibiotic even if you start to feel better. °· Keep the infected arm or leg elevated to reduce swelling. °· Apply a warm cloth to the affected area up to 4 times per day to relieve pain. °· Take medicines only as directed by your health care provider. °· Keep all follow-up visits as directed by your health care provider. °SEEK MEDICAL CARE IF:  °· You notice red streaks coming from the infected area. °· Your red area gets larger or turns dark in color. °· Your bone or joint underneath the infected area becomes painful after the skin has healed. °· Your infection returns in the same area or another area. °· You notice a swollen bump in the infected area. °· You develop new symptoms. °· You have   a fever. °SEEK IMMEDIATE MEDICAL CARE IF:  °· You feel very sleepy. °· You develop vomiting or diarrhea. °· You have a general ill feeling (malaise) with muscle aches and pains. °MAKE SURE YOU:  °· Understand these instructions. °· Will watch your condition. °· Will get help right away if you are not doing well or get worse. °Document Released: 09/01/2005 Document Revised: 04/08/2014 Document  Reviewed: 02/07/2012 °[employer]® Patient Information ©2015 [employer]. This information is not intended to replace advice given to you by your health care provider. Make sure you discuss any questions you have with your health care provider. ° °

## 2015-04-16 NOTE — ED Notes (Addendum)
Pt reports a "knot" to the left of his scrotum. Pt states that it has increased in size over the last few day. Pt states that it now has reddness, heat and red streaks going down his leg. Pt states that the pain started last Thursday and has increased in pain. Pt states that pain is now radiating to abdomen. No visible streaks noted. Pt noted to have approx 4cm reddened area in left groin area.

## 2015-04-16 NOTE — ED Provider Notes (Signed)
CSN: [id number]     Arrival date & time 04/16/15  1824 History   First MD Initiated Contact with Patient 04/16/15 2117     Chief Complaint  Patient presents with  . Groin Pain     (Consider location/radiation/quality/duration/timing/severity/associated sxs/prior Treatment) HPI   29 year old male presents for evaluation of a boil near his left scrotum. Patient noticed a small knot to the left of his scrotum 6 days ago. It has increased in size, or tender to the touch, red and warmth. He took some leftover Vicodin for the past several days with minimal improvement. He denies having fever, abdominal pain, back pain, dysuria, hematuria, penile discharge, or rectal pain. He has never had this before. No history of hernia. He is up-to-date with tetanus.  History reviewed. No pertinent past medical history. Past Surgical History  Procedure Laterality Date  . Fracture surgery     No family history on file. History  Substance Use Topics  . Smoking status: Never Smoker   . Smokeless tobacco: Current User    Types: Chew  . Alcohol Use: No    Review of Systems  Constitutional: Negative for fever.  Genitourinary: Positive for scrotal swelling. Negative for dysuria.  Skin: Positive for rash.      Allergies  Review of patient's allergies indicates no known allergies.  Home Medications   Prior to Admission medications   Medication Sig Start Date End Date Taking? Authorizing Provider  cephALEXin (KEFLEX) 500 MG capsule Take 2 capsules (1,000 mg total) by mouth 2 (two) times daily. Patient not taking: Reported on 04/16/2015 09/24/14   Charlestine Nighthristopher Lawyer, PA-C  HYDROcodone-acetaminophen (NORCO/VICODIN) 5-325 MG per tablet Take 1 tablet by mouth every 6 (six) hours as needed for moderate pain. Patient not taking: Reported on 04/16/2015 09/24/14   Charlestine Nighthristopher Lawyer, PA-C  promethazine (PHENERGAN) 25 MG tablet Take 1 tablet (25 mg total) by mouth every 8 (eight) hours as needed for nausea or  vomiting. Patient not taking: Reported on 04/16/2015 09/24/14   Charlestine Nighthristopher Lawyer, PA-C   BP 138/93 mmHg  Pulse 77  Temp(Src) 98.2 F (36.8 C) (Oral)  Resp 18  Ht 5\' 6"  (1.676 m)  Wt 180 lb (81.647 kg)  BMI 29.07 kg/m2  SpO2 98% Physical Exam  Constitutional: He appears well-developed and well-nourished. No distress.  HENT:  Head: Atraumatic.  Eyes: Conjunctivae are normal.  Neck: Neck supple.  Cardiovascular: Normal rate and regular rhythm.   Pulmonary/Chest: Effort normal and breath sounds normal.  Abdominal: Soft. There is no tenderness.  Genitourinary:  Chaperone present:  An area of induration and fluctuance with exquisite tenderness noted to L groin adjacent to scrotum without scrotal involvement.  No lymphangitis.  No inguinal hernia.  Normal circumcise penis, testicles non tender.    Neurological: He is alert.  Skin: No rash noted.  Psychiatric: He has a normal mood and affect.  Nursing note and vitals reviewed.   ED Course  Procedures (including critical care time)  Pt with a cutaneous abscess noted to L groin adjacent to scrotum with surrounding erythema.  No evidence to suggest Fornier's Gangrene.    INCISION AND DRAINAGE Performed by: Fayrene HelperRAN,Shannan Slinker Consent: Verbal consent obtained. Risks and benefits: risks, benefits and alternatives were discussed Type: abscess  Body area: L groin  Anesthesia: local infiltration  Incision was made with a scalpel.  Local anesthetic: lidocaine 2% w epinephrine  Anesthetic total: 2 ml  Complexity: complex Blunt dissection to break up loculations  Drainage: purulent  Drainage amount: moderate  Packing material: 1/4 in iodoform gauze  Patient tolerance: Patient tolerated the procedure well with no immediate complications.  10:25 PM Successful in size and drainage of left groin abscess. Packing placed. Patient made aware to continue warm compresses several times daily and have packing removal in 2 days. Given evidence  of cellulitis, patient will be discharged with Keflex and Bactrim along with a short course of pain medication. Return precautions discussed.   Labs Review Labs Reviewed  COMPREHENSIVE METABOLIC PANEL - Abnormal; Notable for the following:    Glucose, Bld 113 (*)    All other components within normal limits  CBC WITH DIFFERENTIAL/PLATELET - Abnormal; Notable for the following:    Eosinophils Relative 7 (*)    All other components within normal limits    Imaging Review No results found.   EKG Interpretation None      MDM   Final diagnoses:  Cutaneous abscess of groin    BP 138/93 mmHg  Pulse 77  Temp(Src) 98.2 F (36.8 C) (Oral)  Resp 18  Ht 5\' 6"  (1.676 m)  Wt 180 lb (81.647 kg)  BMI 29.07 kg/m2  SpO2 100%     Fayrene HelperBowie Zayleigh Stroh, PA-C 04/16/15 2225  Raeford RazorStephen Kohut, MD 04/17/15 1622

## 2015-04-25 ENCOUNTER — Telehealth (HOSPITAL_BASED_OUTPATIENT_CLINIC_OR_DEPARTMENT_OTHER): Payer: Self-pay | Admitting: Emergency Medicine

## 2015-07-28 ENCOUNTER — Encounter (HOSPITAL_COMMUNITY): Payer: Self-pay | Admitting: *Deleted

## 2015-07-28 ENCOUNTER — Emergency Department (HOSPITAL_COMMUNITY)
Admission: EM | Admit: 2015-07-28 | Discharge: 2015-07-28 | Disposition: A | Discharge status: Home / Self Care | Payer: 59 | Attending: Emergency Medicine | Admitting: Emergency Medicine

## 2015-07-28 DIAGNOSIS — Z792 Long term (current) use of antibiotics: Secondary | ICD-10-CM | POA: Insufficient documentation

## 2015-07-28 DIAGNOSIS — R509 Fever, unspecified: Secondary | ICD-10-CM | POA: Insufficient documentation

## 2015-07-28 DIAGNOSIS — Z79899 Other long term (current) drug therapy: Secondary | ICD-10-CM | POA: Insufficient documentation

## 2015-07-28 DIAGNOSIS — H65191 Other acute nonsuppurative otitis media, right ear: Secondary | ICD-10-CM | POA: Insufficient documentation

## 2015-07-28 MED ORDER — HYDROCODONE-ACETAMINOPHEN 5-325 MG PO TABS
1.0000 | ORAL_TABLET | Freq: Four times a day (QID) | ORAL | Status: DC | PRN
Start: 2015-07-28 — End: 2018-12-26

## 2015-07-28 MED ORDER — CIPROFLOXACIN HCL 500 MG PO TABS: 500.0000 mg | ORAL_TABLET | Freq: Two times a day (BID) | ORAL | Status: DC

## 2015-07-28 MED ORDER — IBUPROFEN 600 MG PO TABS: 600.0000 mg | ORAL_TABLET | Freq: Four times a day (QID) | ORAL | Status: DC | PRN

## 2015-07-28 MED ORDER — IBUPROFEN 400 MG PO TABS
600.0000 mg | ORAL_TABLET | Freq: Once | ORAL | Status: AC
  Administered 2015-07-28: 600 mg via ORAL
  Filled 2015-07-28: qty 3

## 2015-07-28 MED ORDER — AMOXICILLIN 500 MG PO CAPS: 500.0000 mg | ORAL_CAPSULE | Freq: Three times a day (TID) | ORAL | Status: DC

## 2015-07-28 NOTE — ED Notes (Signed)
Pt is in stable condition upon d/c and ambulates from ED. 

## 2015-07-28 NOTE — ED Notes (Signed)
Pt arrives from home with c/o ear pain x1 week. Pt states he has tried sweet oil, peroxide and other "old remedies" at home with no relief.

## 2015-07-28 NOTE — Discharge Instructions (Signed)
Your ears canal is inflammed. Provided is antibiotics for external and internal ear infection.  Please return to the ER if your symptoms worsen; you have increased pain, fevers, chills, inability to keep any medications down, confusion, hearing loss, neck pain or stiffness.    Otitis Media Otitis media is redness, soreness, and inflammation of the middle ear. Otitis media may be caused by allergies or, most commonly, by infection. Often it occurs as a complication of the common cold. SIGNS AND SYMPTOMS Symptoms of otitis media may include:  Earache.  Fever.  Ringing in your ear.  Headache.  Leakage of fluid from the ear. DIAGNOSIS To diagnose otitis media, your health care provider will examine your ear with an otoscope. This is an instrument that allows your health care provider to see into your ear in order to examine your eardrum. Your health care provider also will ask you questions about your symptoms. TREATMENT  Typically, otitis media resolves on its own within 3-5 days. Your health care provider may prescribe medicine to ease your symptoms of pain. If otitis media does not resolve within 5 days or is recurrent, your health care provider may prescribe antibiotic medicines if he or she suspects that a bacterial infection is the cause. HOME CARE INSTRUCTIONS   If you were prescribed an antibiotic medicine, finish it all even if you start to feel better.  Take medicines only as directed by your health care provider.  Keep all follow-up visits as directed by your health care provider. SEEK MEDICAL CARE IF:  You have otitis media only in one ear, or bleeding from your nose, or both.  You notice a lump on your neck.  You are not getting better in 3-5 days.  You feel worse instead of better. SEEK IMMEDIATE MEDICAL CARE IF:   You have pain that is not controlled with medicine.  You have swelling, redness, or pain around your ear or stiffness in your neck.  You notice that  part of your face is paralyzed.  You notice that the bone behind your ear (mastoid) is tender when you touch it. MAKE SURE YOU:   Understand these instructions.  Will watch your condition.  Will get help right away if you are not doing well or get worse. Document Released: 08/27/2004 Document Revised: 04/08/2014 Document Reviewed: 06/19/2013 [facility] Patient Information 2015 [place], [place]. This information is not intended to replace advice given to you by your health care provider. Make sure you discuss any questions you have with your health care provider. Otitis Externa Otitis externa is a bacterial or fungal infection of the outer ear canal. This is the area from the eardrum to the outside of the ear. Otitis externa is sometimes called "swimmer's ear." CAUSES  Possible causes of infection include:  Swimming in dirty water.  Moisture remaining in the ear after swimming or bathing.  Mild injury (trauma) to the ear.  Objects stuck in the ear (foreign body).  Cuts or scrapes (abrasions) on the outside of the ear. SIGNS AND SYMPTOMS  The first symptom of infection is often itching in the ear canal. Later signs and symptoms may include swelling and redness of the ear canal, ear pain, and yellowish-white fluid (pus) coming from the ear. The ear pain may be worse when pulling on the earlobe. DIAGNOSIS  Your health care provider will perform a physical exam. A sample of fluid may be taken from the ear and examined for bacteria or fungi. TREATMENT  Antibiotic ear drops are often  given for 10 to 14 days. Treatment may also include pain medicine or corticosteroids to reduce itching and swelling. HOME CARE INSTRUCTIONS   Apply antibiotic ear drops to the ear canal as prescribed by your health care provider.  Take medicines only as directed by your health care provider.  If you have diabetes, follow any additional treatment instructions from your health care provider.  Keep all  follow-up visits as directed by your health care provider. PREVENTION   Keep your ear dry. Use the corner of a towel to absorb water out of the ear canal after swimming or bathing.  Avoid scratching or putting objects inside your ear. This can damage the ear canal or remove the protective wax that lines the canal. This makes it easier for bacteria and fungi to grow.  Avoid swimming in lakes, polluted water, or poorly chlorinated pools.  You may use ear drops made of rubbing alcohol and vinegar after swimming. Combine equal parts of white vinegar and alcohol in a bottle. Put 3 or 4 drops into each ear after swimming. SEEK MEDICAL CARE IF:   You have a fever.  Your ear is still red, swollen, painful, or draining pus after 3 days.  Your redness, swelling, or pain gets worse.  You have a severe headache.  You have redness, swelling, pain, or tenderness in the area behind your ear. MAKE SURE YOU:   Understand these instructions.  Will watch your condition.  Will get help right away if you are not doing well or get worse. Document Released: 11/22/2005 Document Revised: 04/08/2014 Document Reviewed: 12/09/2011 [facility] Patient Information 2015 [place], [place]. This information is not intended to replace advice given to you by your health care provider. Make sure you discuss any questions you have with your health care provider.

## 2015-07-28 NOTE — ED Provider Notes (Signed)
CSN: [id number]     Arrival date & time 07/28/15  1319 History  This chart was scribed for Derwood Kaplan, MD by Murriel Hopper, ED Scribe. This patient was seen in room TR08C/TR08C and the patient's care was started at 2:11 PM.     Chief Complaint  Patient presents with  . Otalgia      The history is provided by the patient. No language interpreter was used.     HPI Comments: Tyler Robertson is a 29 y.o. male who presents to the Emergency Department complaining of constant, worsening, throbbing right ear pain with associated subjective chills that has been present for one week. Pt reports his ear pain began one week ago but states his chills began yesterday. Pt states he tried home remedies to treat his pain by putting "sweet oil" in his ear. Pt also reports the right side of his head feels sore, and states he has woken up in the middle of the night a few times because of the pain. Pt denies neck pain or ear drainage, and denies a hx of similar problems in the past. No headaches, dizziness, hearing loss.    History reviewed. No pertinent past medical history. Past Surgical History  Procedure Laterality Date  . Fracture surgery     History reviewed. No pertinent family history. Social History  Substance Use Topics  . Smoking status: Never Smoker   . Smokeless tobacco: Current User    Types: Chew  . Alcohol Use: No    Review of Systems  Constitutional: Positive for fever and chills.  HENT: Positive for ear pain. Negative for ear discharge.   Psychiatric/Behavioral: Positive for sleep disturbance.      Allergies  Review of patient's allergies indicates no known allergies.  Home Medications   Prior to Admission medications   Medication Sig Start Date End Date Taking? Authorizing Provider  amoxicillin (AMOXIL) 500 MG capsule Take 1 capsule (500 mg total) by mouth 3 (three) times daily. 07/28/15   Derwood Kaplan, MD  cephALEXin (KEFLEX) 500 MG capsule Take 2 capsules (1,000  mg total) by mouth 2 (two) times daily. 04/16/15   Fayrene Helper, PA-C  ciprofloxacin (CIPRO) 500 MG tablet Take 1 tablet (500 mg total) by mouth 2 (two) times daily. 07/28/15   Derwood Kaplan, MD  HYDROcodone-acetaminophen (NORCO/VICODIN) 5-325 MG per tablet Take 1 tablet by mouth every 6 (six) hours as needed. 07/28/15   Derwood Kaplan, MD  ibuprofen (ADVIL,MOTRIN) 600 MG tablet Take 1 tablet (600 mg total) by mouth every 6 (six) hours as needed. 07/28/15   Derwood Kaplan, MD  promethazine (PHENERGAN) 25 MG tablet Take 1 tablet (25 mg total) by mouth every 8 (eight) hours as needed for nausea or vomiting. Patient not taking: Reported on 04/16/2015 09/24/14   Charlestine Night, PA-C   BP 174/149 mmHg  Temp(Src) 98.2 F (36.8 C) (Oral)  Resp 16  Ht  (1.727 m)  Wt 184 lb (83.462 kg)  BMI 27.98 kg/m2  SpO2 100% Physical Exam  Constitutional: He is oriented to person, place, and time. He appears well-developed and well-nourished.  HENT:  Head: Normocephalic and atraumatic.  External ears including helix are not edematous  Internal canal is erythematous and edematous  No light reflex No debris or purulence  No tenderness over the mastoid process No pre or post auricular lymphadenopathy  No anterior cervical lymphadenopathy   Cardiovascular: Normal rate.   Pulmonary/Chest: Effort normal.  Abdominal: He exhibits no distension.  Neurological: He is  alert and oriented to person, place, and time.  No meningismus   Skin: Skin is warm and dry.  Psychiatric: He has a normal mood and affect.  Nursing note and vitals reviewed.   ED Course  Procedures (including critical care time)  DIAGNOSTIC STUDIES: Oxygen Saturation is 100% on room air, normal by my interpretation.    COORDINATION OF CARE: 2:15 PM Discussed treatment plan with pt at bedside and pt agreed to plan.   Labs Review Labs Reviewed - No data to display  Imaging Review No results found.    EKG Interpretation None       MDM   Final diagnoses:  Acute nonsuppurative otitis media of right ear    I personally performed the services described in this documentation, which was scribed in my presence. The recorded information has been reviewed and is accurate.  Pt with ear pain, R side. The canal in inflamed and erythematous - but there is no pus or debri. No meningismus, trismus, non toxic patient. No mastoid tenderness. Strict return precautions discussed - will tx as AOM and AEM with orals as the otic drops are expensive.    Derwood Kaplan, MD 07/28/15 1442

## 2018-05-28 ENCOUNTER — Encounter (HOSPITAL_COMMUNITY)
Admission: EM | Disposition: A | Discharge status: Home / Self Care | Payer: Self-pay | Source: Home / Self Care | Attending: Orthopedic Surgery

## 2018-05-28 ENCOUNTER — Emergency Department (HOSPITAL_COMMUNITY): Payer: BLUE CROSS/BLUE SHIELD

## 2018-05-28 ENCOUNTER — Encounter (HOSPITAL_COMMUNITY): Payer: Self-pay | Admitting: Emergency Medicine

## 2018-05-28 ENCOUNTER — Emergency Department (HOSPITAL_COMMUNITY): Payer: BLUE CROSS/BLUE SHIELD | Admitting: Certified Registered Nurse Anesthetist

## 2018-05-28 ENCOUNTER — Inpatient Hospital Stay (HOSPITAL_COMMUNITY)
Admission: EM | Admit: 2018-05-28 | Discharge: 2018-05-29 | DRG: 494 | Disposition: A | Discharge status: Home / Self Care | Payer: BLUE CROSS/BLUE SHIELD | Attending: Orthopedic Surgery | Admitting: Orthopedic Surgery

## 2018-05-28 ENCOUNTER — Inpatient Hospital Stay (HOSPITAL_COMMUNITY): Payer: BLUE CROSS/BLUE SHIELD

## 2018-05-28 DIAGNOSIS — Z79899 Other long term (current) drug therapy: Secondary | ICD-10-CM | POA: Diagnosis not present

## 2018-05-28 DIAGNOSIS — S82431A Displaced oblique fracture of shaft of right fibula, initial encounter for closed fracture: Secondary | ICD-10-CM | POA: Diagnosis present

## 2018-05-28 DIAGNOSIS — S82301A Unspecified fracture of lower end of right tibia, initial encounter for closed fracture: Secondary | ICD-10-CM

## 2018-05-28 DIAGNOSIS — Z419 Encounter for procedure for purposes other than remedying health state, unspecified: Secondary | ICD-10-CM

## 2018-05-28 DIAGNOSIS — S8251XA Displaced fracture of medial malleolus of right tibia, initial encounter for closed fracture: Secondary | ICD-10-CM | POA: Diagnosis present

## 2018-05-28 DIAGNOSIS — S82201A Unspecified fracture of shaft of right tibia, initial encounter for closed fracture: Secondary | ICD-10-CM | POA: Diagnosis present

## 2018-05-28 DIAGNOSIS — S82831A Other fracture of upper and lower end of right fibula, initial encounter for closed fracture: Secondary | ICD-10-CM

## 2018-05-28 DIAGNOSIS — S82391A Other fracture of lower end of right tibia, initial encounter for closed fracture: Secondary | ICD-10-CM | POA: Diagnosis present

## 2018-05-28 DIAGNOSIS — S82891A Other fracture of right lower leg, initial encounter for closed fracture: Principal | ICD-10-CM | POA: Diagnosis present

## 2018-05-28 HISTORY — PX: TIBIA IM NAIL INSERTION: SHX2516

## 2018-05-28 SURGERY — INSERTION, INTRAMEDULLARY ROD, TIBIA
Anesthesia: General | Laterality: Right

## 2018-05-28 MED ORDER — PROPOFOL 10 MG/ML IV BOLUS
INTRAVENOUS | Status: AC
  Filled 2018-05-28: qty 20

## 2018-05-28 MED ORDER — FENTANYL CITRATE (PF) 100 MCG/2ML IJ SOLN
INTRAMUSCULAR | Status: AC
  Administered 2018-05-28: 50 ug via INTRAVENOUS
  Filled 2018-05-28: qty 2

## 2018-05-28 MED ORDER — OXYCODONE HCL 5 MG/5ML PO SOLN: 5.0000 mg | Freq: Once | ORAL | Status: DC | PRN

## 2018-05-28 MED ORDER — KETOROLAC TROMETHAMINE 30 MG/ML IJ SOLN
INTRAMUSCULAR | Status: AC
Start: 2018-05-28 — End: ?
  Filled 2018-05-28: qty 1

## 2018-05-28 MED ORDER — CEFAZOLIN SODIUM-DEXTROSE 2-3 GM-%(50ML) IV SOLR
INTRAVENOUS | Status: DC | PRN
  Administered 2018-05-28: 2 g via INTRAVENOUS

## 2018-05-28 MED ORDER — PROPOFOL 10 MG/ML IV BOLUS
INTRAVENOUS | Status: DC | PRN
  Administered 2018-05-28: 200 mg via INTRAVENOUS

## 2018-05-28 MED ORDER — KETOROLAC TROMETHAMINE 30 MG/ML IJ SOLN
INTRAMUSCULAR | Status: DC | PRN
  Administered 2018-05-28: 30 mg via INTRAVENOUS

## 2018-05-28 MED ORDER — LACTATED RINGERS IV SOLN
INTRAVENOUS | Status: DC | PRN
  Administered 2018-05-28 (×2): via INTRAVENOUS

## 2018-05-28 MED ORDER — SODIUM CHLORIDE 0.9 % IJ SOLN
INTRAMUSCULAR | Status: AC
  Filled 2018-05-28: qty 10

## 2018-05-28 MED ORDER — ONDANSETRON HCL 4 MG/2ML IJ SOLN
INTRAMUSCULAR | Status: AC
  Filled 2018-05-28: qty 2

## 2018-05-28 MED ORDER — MORPHINE SULFATE (PF) 4 MG/ML IV SOLN
4.0000 mg | Freq: Once | INTRAVENOUS | Status: AC
  Administered 2018-05-28: 4 mg via INTRAVENOUS
  Filled 2018-05-28: qty 1

## 2018-05-28 MED ORDER — DEXAMETHASONE SODIUM PHOSPHATE 10 MG/ML IJ SOLN
INTRAMUSCULAR | Status: DC | PRN
  Administered 2018-05-28: 10 mg via INTRAVENOUS

## 2018-05-28 MED ORDER — ONDANSETRON HCL 4 MG/2ML IJ SOLN: 4.0000 mg | Freq: Once | INTRAMUSCULAR | Status: DC | PRN

## 2018-05-28 MED ORDER — FENTANYL CITRATE (PF) 100 MCG/2ML IJ SOLN
50.0000 ug | INTRAMUSCULAR | Status: DC | PRN
  Administered 2018-05-28 (×2): 50 ug via INTRAVENOUS
  Filled 2018-05-28 (×2): qty 2

## 2018-05-28 MED ORDER — ONDANSETRON 4 MG PO TBDP: 4.0000 mg | ORAL_TABLET | Freq: Three times a day (TID) | ORAL | 0 refills | Status: DC | PRN

## 2018-05-28 MED ORDER — SUCCINYLCHOLINE CHLORIDE 200 MG/10ML IV SOSY
PREFILLED_SYRINGE | INTRAVENOUS | Status: DC | PRN
  Administered 2018-05-28: 140 mg via INTRAVENOUS

## 2018-05-28 MED ORDER — ONDANSETRON HCL 4 MG/2ML IJ SOLN
INTRAMUSCULAR | Status: DC | PRN
  Administered 2018-05-28: 4 mg via INTRAVENOUS

## 2018-05-28 MED ORDER — OXYCODONE HCL 5 MG PO TABS: 5.0000 mg | ORAL_TABLET | Freq: Once | ORAL | Status: DC | PRN

## 2018-05-28 MED ORDER — MIDAZOLAM HCL 2 MG/2ML IJ SOLN
INTRAMUSCULAR | Status: AC
  Filled 2018-05-28: qty 2

## 2018-05-28 MED ORDER — 0.9 % SODIUM CHLORIDE (POUR BTL) OPTIME
TOPICAL | Status: DC | PRN
  Administered 2018-05-28: 1000 mL

## 2018-05-28 MED ORDER — LIDOCAINE 2% (20 MG/ML) 5 ML SYRINGE
INTRAMUSCULAR | Status: AC
  Filled 2018-05-28: qty 5

## 2018-05-28 MED ORDER — STERILE WATER FOR IRRIGATION IR SOLN
Status: DC | PRN
  Administered 2018-05-28: 1000 mL

## 2018-05-28 MED ORDER — LIDOCAINE 2% (20 MG/ML) 5 ML SYRINGE
INTRAMUSCULAR | Status: DC | PRN
  Administered 2018-05-28: 100 mg via INTRAVENOUS

## 2018-05-28 MED ORDER — OXYCODONE HCL 5 MG PO TABS: 5.0000 mg | ORAL_TABLET | ORAL | 0 refills | Status: DC | PRN

## 2018-05-28 MED ORDER — FENTANYL CITRATE (PF) 100 MCG/2ML IJ SOLN: 25.0000 ug | INTRAMUSCULAR | Status: DC | PRN

## 2018-05-28 MED ORDER — SUCCINYLCHOLINE CHLORIDE 20 MG/ML IJ SOLN
INTRAMUSCULAR | Status: AC
  Filled 2018-05-28: qty 1

## 2018-05-28 MED ORDER — SUFENTANIL CITRATE 50 MCG/ML IV SOLN
INTRAVENOUS | Status: AC
  Filled 2018-05-28: qty 1

## 2018-05-28 MED ORDER — SUFENTANIL CITRATE 50 MCG/ML IV SOLN
INTRAVENOUS | Status: DC | PRN
  Administered 2018-05-28 (×2): 10 ug via INTRAVENOUS
  Administered 2018-05-28: 5 ug via INTRAVENOUS
  Administered 2018-05-28: 10 ug via INTRAVENOUS
  Administered 2018-05-28: 15 ug via INTRAVENOUS

## 2018-05-28 MED ORDER — CEFAZOLIN SODIUM 1 G IJ SOLR
INTRAMUSCULAR | Status: AC
  Filled 2018-05-28: qty 20

## 2018-05-28 MED ORDER — FENTANYL CITRATE (PF) 100 MCG/2ML IJ SOLN
25.0000 ug | INTRAMUSCULAR | Status: DC | PRN
  Administered 2018-05-28 (×2): 50 ug via INTRAVENOUS

## 2018-05-28 MED ORDER — DEXAMETHASONE SODIUM PHOSPHATE 10 MG/ML IJ SOLN
INTRAMUSCULAR | Status: AC
  Filled 2018-05-28: qty 1

## 2018-05-28 MED ORDER — MIDAZOLAM HCL 5 MG/5ML IJ SOLN
INTRAMUSCULAR | Status: DC | PRN
  Administered 2018-05-28: 2 mg via INTRAVENOUS

## 2018-05-28 SURGICAL SUPPLY — 67 items
ALCOHOL 70% 16 OZ (MISCELLANEOUS) ×3 IMPLANT
BANDAGE ACE 4X5 VEL STRL LF (GAUZE/BANDAGES/DRESSINGS) ×3 IMPLANT
BANDAGE ACE 6X5 VEL STRL LF (GAUZE/BANDAGES/DRESSINGS) ×3 IMPLANT
BANDAGE ESMARK 6X9 LF (GAUZE/BANDAGES/DRESSINGS) ×1 IMPLANT
BIT DRILL 2.4 AO COUPLING CANN (BIT) ×2 IMPLANT
BIT DRILL CALIBRATED 4.3X320MM (BIT) IMPLANT
BIT DRILL CROWE POINT TWST 4.3 (DRILL) IMPLANT
BNDG CMPR 9X6 STRL LF SNTH (GAUZE/BANDAGES/DRESSINGS) ×1
BNDG ESMARK 6X9 LF (GAUZE/BANDAGES/DRESSINGS) ×3
COVER MAYO STAND STRL (DRAPES) IMPLANT
COVER SURGICAL LIGHT HANDLE (MISCELLANEOUS) ×3 IMPLANT
CUFF TOURNIQUET SINGLE 34IN LL (TOURNIQUET CUFF) ×1 IMPLANT
DRAPE C-ARM 42X72 X-RAY (DRAPES) ×3 IMPLANT
DRAPE C-ARMOR (DRAPES) ×3 IMPLANT
DRAPE HALF SHEET 40X57 (DRAPES) IMPLANT
DRAPE IMP U-DRAPE 54X76 (DRAPES) ×3 IMPLANT
DRAPE POUCH INSTRU U-SHP 10X18 (DRAPES) ×3 IMPLANT
DRAPE U-SHAPE 47X51 STRL (DRAPES) ×3 IMPLANT
DRAPE UTILITY XL STRL (DRAPES) ×2 IMPLANT
DRILL CALIBRATED 4.3X320MM (BIT) ×3
DRILL CROWE POINT TWIST 4.3 (DRILL) ×6
DRSG ADAPTIC 3X8 NADH LF (GAUZE/BANDAGES/DRESSINGS) ×2 IMPLANT
DURAPREP 26ML APPLICATOR (WOUND CARE) ×3 IMPLANT
ELECT CAUTERY BLADE 6.4 (BLADE) ×3 IMPLANT
ELECT REM PT RETURN 9FT ADLT (ELECTROSURGICAL) ×3
ELECTRODE REM PT RTRN 9FT ADLT (ELECTROSURGICAL) ×1 IMPLANT
FACESHIELD WRAPAROUND (MASK) ×3 IMPLANT
FACESHIELD WRAPAROUND OR TEAM (MASK) ×2 IMPLANT
GAUZE SPONGE 4X4 12PLY STRL (GAUZE/BANDAGES/DRESSINGS) ×3 IMPLANT
GAUZE XEROFORM 1X8 LF (GAUZE/BANDAGES/DRESSINGS) ×2 IMPLANT
GLOVE BIO SURGEON STRL SZ 6.5 (GLOVE) ×1 IMPLANT
GLOVE BIO SURGEON STRL SZ7 (GLOVE) ×4 IMPLANT
GLOVE BIO SURGEON STRL SZ7.5 (GLOVE) ×5 IMPLANT
GLOVE BIO SURGEONS STRL SZ 6.5 (GLOVE) ×1
GLOVE BIOGEL PI IND STRL 8 (GLOVE) ×1 IMPLANT
GLOVE BIOGEL PI INDICATOR 8 (GLOVE) ×4
GLOVE SS N UNI LF 7.0 STRL (GLOVE) ×4 IMPLANT
GOWN STRL REUS W/ TWL LRG LVL3 (GOWN DISPOSABLE) ×2 IMPLANT
GOWN STRL REUS W/ TWL XL LVL3 (GOWN DISPOSABLE) ×1 IMPLANT
GOWN STRL REUS W/TWL LRG LVL3 (GOWN DISPOSABLE) ×6
GOWN STRL REUS W/TWL XL LVL3 (GOWN DISPOSABLE) ×3
GUIDEWIRE 2.6X80 BEAD TIP (WIRE) ×1 IMPLANT
GUIDWIRE 2.6X80 BEAD TIP (WIRE) ×3
K-WIRE TROC 1.25X150 (WIRE) ×3
KIT BASIN OR (CUSTOM PROCEDURE TRAY) ×3 IMPLANT
KWIRE TROC 1.25X150 (WIRE) ×1 IMPLANT
MANIFOLD NEPTUNE II (INSTRUMENTS) ×3 IMPLANT
NAIL TIBIAL PHOENIX 9.0X340MM (Nail) ×2 IMPLANT
NS IRRIG 1000ML POUR BTL (IV SOLUTION) ×3 IMPLANT
PACK TOTAL JOINT (CUSTOM PROCEDURE TRAY) ×3 IMPLANT
PACK UNIVERSAL I (CUSTOM PROCEDURE TRAY) ×3 IMPLANT
PAD CAST 4YDX4 CTTN HI CHSV (CAST SUPPLIES) ×2 IMPLANT
PADDING CAST COTTON 4X4 STRL (CAST SUPPLIES) ×3
SCREW CANN PT 4X40 NS (Screw) IMPLANT
SCREW CANNULATED PT 4.0X40 (Screw) ×3 IMPLANT
SCREW CORT TI DBL LEAD 5X34 (Screw) ×2 IMPLANT
SCREW CORT TI DBL LEAD 5X38 (Screw) ×3 IMPLANT
SCREW CORT TI DBL LEAD 5X42 (Screw) ×2 IMPLANT
SCREW CORT TI DBL LEAD 5X44 (Screw) ×4 IMPLANT
SCREW CORT TI DBL LEAD 5X48 (Screw) ×2 IMPLANT
SCREW CORT TI DBL LEAD 5X65 (Screw) ×2 IMPLANT
STAPLER SKIN PROX WIDE 3.9 (STAPLE) ×3 IMPLANT
SUT MON AB 2-0 CT1 36 (SUTURE) ×6 IMPLANT
SUT VIC AB 0 CT1 27 (SUTURE) ×3
SUT VIC AB 0 CT1 27XBRD ANBCTR (SUTURE) IMPLANT
TOWEL OR 17X26 10 PK STRL BLUE (TOWEL DISPOSABLE) ×3 IMPLANT
WATER STERILE IRR 1000ML POUR (IV SOLUTION) ×3 IMPLANT

## 2018-05-28 NOTE — Brief Op Note (Signed)
05/28/2018  11:07 PM  PATIENT:  Tyler Robertson  32 y.o. male  PRE-OPERATIVE DIAGNOSIS:  fracture  POST-OPERATIVE DIAGNOSIS:  distal tibial shaft fracture  PROCEDURE:  Procedure(s): INTRAMEDULLARY (IM) NAIL TIBIAL (Right)  SURGEON:  Surgeon(s) and Role:    * Yolonda Kidaogers, Jason Patrick, MD - Primary  PHYSICIAN ASSISTANT:   ASSISTANTS: none   ANESTHESIA:   general  EBL:  150 mL   BLOOD ADMINISTERED:none  DRAINS: none   LOCAL MEDICATIONS USED:  NONE  SPECIMEN:  No Specimen  DISPOSITION OF SPECIMEN:  N/A  COUNTS:  YES  TOURNIQUET:  * Missing tourniquet times found for documented tourniquets in log: [id number] *  DICTATION: .Note written in EPIC  PLAN OF CARE: Admit to inpatient   PATIENT DISPOSITION:  PACU - hemodynamically stable.   Delay start of Pharmacological VTE agent (>24hrs) due to surgical blood loss or risk of bleeding: not applicable

## 2018-05-28 NOTE — ED Notes (Signed)
Patient transported to CT 
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
## 2018-05-28 NOTE — Anesthesia Preprocedure Evaluation (Addendum)
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
## 2018-05-28 NOTE — Anesthesia Procedure Notes (Signed)
Procedure Name: Intubation Date/Time: 05/28/2018 8:46 PM Performed by: Melina SchoolsBanks, Muhammadali Ries J, CRNA Pre-anesthesia Checklist: Patient identified, Emergency Drugs available, Suction available, Patient being monitored and Timeout performed Patient Re-evaluated:Patient Re-evaluated prior to induction Oxygen Delivery Method: Circle system utilized Preoxygenation: Pre-oxygenation with 100% oxygen Induction Type: IV induction, Rapid sequence and Cricoid Pressure applied Grade View: Grade II Tube type: Oral Tube size: 7.5 mm Number of attempts: 1 Airway Equipment and Method: Stylet Placement Confirmation: ETT inserted through vocal cords under direct vision,  positive ETCO2 and breath sounds checked- equal and bilateral Secured at: 23 cm Tube secured with: Tape Dental Injury: Teeth and Oropharynx as per pre-operative assessment

## 2018-05-28 NOTE — Progress Notes (Signed)
Orthopedic Tech Progress Note Patient Details:  Drue SecondDustin S Dario 05/27/1986 [id number]  Ortho Devices Type of Ortho Device: Ace wrap, Post (long leg) splint, Stirrup splint Ortho Device/Splint Location: RLE Ortho Device/Splint Interventions: Ordered, Application   Post Interventions Patient Tolerated: Well   Jennye MoccasinHughes, Keimani Laufer Craig 05/28/2018, 4:31 PM

## 2018-05-28 NOTE — Transfer of Care (Signed)
Immediate Anesthesia Transfer of Care Note  Patient: Tyler Robertson  Procedure(s) Performed: INTRAMEDULLARY (IM) NAIL TIBIAL (Right )  Patient Location: PACU  Anesthesia Type:General  Level of Consciousness: awake, alert , oriented and drowsy  Airway & Oxygen Therapy: Patient Spontanous Breathing and Patient connected to nasal cannula oxygen  Post-op Assessment: Report given to RN, Post -op Vital signs reviewed and stable and Patient moving all extremities X 4  Post vital signs: Reviewed and stable  Last Vitals:  Vitals Value Taken Time  BP 184/85 05/28/2018 11:06 PM  Temp    Pulse 99 05/28/2018 11:08 PM  Resp 20 05/28/2018 11:08 PM  SpO2 96 % 05/28/2018 11:08 PM  Vitals shown include unvalidated device data.  Last Pain:  Vitals:   05/28/18 1429  PainSc: 9          Complications: No apparent anesthesia complications
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
## 2018-05-28 NOTE — ED Notes (Signed)
Portable xray at bedside.

## 2018-05-28 NOTE — Discharge Instructions (Signed)
Orthopedic discharge instructions: -Foot touch weightbearing only to the right lower extremity -Please maintain elevation of the right lower extremity with "toes above nose."  As long as you can throughout the day. -Maintain your fracture boot to the operative extremity at all times unless changing closed or showering. -Maintain your postoperative bandages until your follow-up appointment.  You may remove your Ace bandage on the third postoperative day.  You may begin showering at that time. -Return to see Dr. Aundria Rudogers in 2 weeks. -For mild to moderate pain use Tylenol and/or Advil as needed.  For breakthrough pain use oxycodone as needed. - for the prevention of blood clots take an 81 mg aspirin twice daily for 6 weeks.

## 2018-05-29 ENCOUNTER — Other Ambulatory Visit: Payer: Self-pay

## 2018-05-29 ENCOUNTER — Encounter (HOSPITAL_COMMUNITY): Payer: Self-pay | Admitting: Orthopedic Surgery

## 2018-05-29 LAB — CBC
HCT: 41.6 % (ref 39.0–52.0)
Hemoglobin: 14.1 g/dL (ref 13.0–17.0)
MCH: 29.3 pg (ref 26.0–34.0)
MCHC: 33.9 g/dL (ref 30.0–36.0)
MCV: 86.5 fL (ref 78.0–100.0)
Platelets: 233 10*3/uL (ref 150–400)
RBC: 4.81 MIL/uL (ref 4.22–5.81)
RDW: 11.8 % (ref 11.5–15.5)
WBC: 10.4 10*3/uL (ref 4.0–10.5)

## 2018-05-29 LAB — HIV ANTIBODY (ROUTINE TESTING W REFLEX): HIV Screen 4th Generation wRfx: NONREACTIVE

## 2018-05-29 LAB — CREATININE, SERUM
Creatinine, Ser: 1.13 mg/dL (ref 0.61–1.24)
GFR calc non Af Amer: >60 mL/min (ref >60)

## 2018-05-29 MED ORDER — ENOXAPARIN SODIUM 40 MG/0.4ML ~~LOC~~ SOLN: 40.0000 mg | Freq: Every day | SUBCUTANEOUS | Status: DC

## 2018-05-29 MED ORDER — LORATADINE 10 MG PO TABS
10.0000 mg | ORAL_TABLET | Freq: Every day | ORAL | Status: DC
  Administered 2018-05-29: 10 mg via ORAL
  Filled 2018-05-29: qty 1

## 2018-05-29 MED ORDER — ONDANSETRON HCL 4 MG PO TABS: 4.0000 mg | ORAL_TABLET | Freq: Four times a day (QID) | ORAL | Status: DC | PRN

## 2018-05-29 MED ORDER — ACETAMINOPHEN 325 MG PO TABS
650.0000 mg | ORAL_TABLET | Freq: Four times a day (QID) | ORAL | Status: DC | PRN
  Filled 2018-05-29 (×2): qty 2

## 2018-05-29 MED ORDER — METHOCARBAMOL 500 MG PO TABS: 500.0000 mg | ORAL_TABLET | Freq: Four times a day (QID) | ORAL | Status: DC | PRN

## 2018-05-29 MED ORDER — ESCITALOPRAM OXALATE 10 MG PO TABS
20.0000 mg | ORAL_TABLET | Freq: Every day | ORAL | Status: DC
  Administered 2018-05-29: 20 mg via ORAL
  Filled 2018-05-29: qty 2

## 2018-05-29 MED ORDER — ACETAMINOPHEN 500 MG PO TABS
1000.0000 mg | ORAL_TABLET | Freq: Four times a day (QID) | ORAL | Status: DC
  Administered 2018-05-29 (×2): 1000 mg via ORAL
  Filled 2018-05-29 (×2): qty 2

## 2018-05-29 MED ORDER — METHOCARBAMOL 1000 MG/10ML IJ SOLN: 500.0000 mg | Freq: Four times a day (QID) | INTRAMUSCULAR | Status: DC | PRN

## 2018-05-29 MED ORDER — FAMOTIDINE 20 MG PO TABS
20.0000 mg | ORAL_TABLET | Freq: Every day | ORAL | Status: DC
  Administered 2018-05-29: 20 mg via ORAL
  Filled 2018-05-29: qty 1

## 2018-05-29 MED ORDER — ACETAMINOPHEN 650 MG RE SUPP: 650.0000 mg | Freq: Four times a day (QID) | RECTAL | Status: DC | PRN

## 2018-05-29 MED ORDER — MORPHINE SULFATE (PF) 2 MG/ML IV SOLN: 2.0000 mg | INTRAVENOUS | Status: DC | PRN

## 2018-05-29 MED ORDER — OXYCODONE HCL 5 MG PO TABS
5.0000 mg | ORAL_TABLET | ORAL | Status: DC | PRN
  Administered 2018-05-29 (×3): 10 mg via ORAL
  Filled 2018-05-29 (×3): qty 2

## 2018-05-29 MED ORDER — ONDANSETRON HCL 4 MG/2ML IJ SOLN: 4.0000 mg | Freq: Four times a day (QID) | INTRAMUSCULAR | Status: DC | PRN

## 2018-05-29 MED ORDER — KETOROLAC TROMETHAMINE 15 MG/ML IJ SOLN
15.0000 mg | Freq: Four times a day (QID) | INTRAMUSCULAR | Status: DC
  Administered 2018-05-29 (×2): 15 mg via INTRAVENOUS
  Filled 2018-05-29 (×2): qty 1

## 2018-05-29 MED ORDER — CEFAZOLIN SODIUM-DEXTROSE 1-4 GM/50ML-% IV SOLN
1.0000 g | Freq: Three times a day (TID) | INTRAVENOUS | Status: AC
  Administered 2018-05-29 (×2): 1 g via INTRAVENOUS
  Filled 2018-05-29 (×2): qty 50

## 2018-05-29 MED ORDER — DOCUSATE SODIUM 100 MG PO CAPS
100.0000 mg | ORAL_CAPSULE | Freq: Two times a day (BID) | ORAL | Status: DC
  Administered 2018-05-29: 100 mg via ORAL
  Filled 2018-05-29: qty 1
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
## 2018-05-29 NOTE — Plan of Care (Signed)
  Problem: Education: Goal: Knowledge of General Education information will improve 05/29/2018 1410 by Darreld Mcleanox, Mckenzy Salazar, RN Outcome: Adequate for Discharge 05/29/2018 1022 by Darreld Mcleanox, Hensley Aziz, RN Outcome: Progressing

## 2018-05-29 NOTE — Anesthesia Postprocedure Evaluation (Signed)
Anesthesia Post Note  Patient: Tyler Robertson  Procedure(s) Performed: INTRAMEDULLARY (IM) NAIL TIBIAL (Right )     Patient location during evaluation: PACU Anesthesia Type: General Level of consciousness: awake and alert Pain management: pain level controlled Vital Signs Assessment: post-procedure vital signs reviewed and stable Respiratory status: spontaneous breathing, nonlabored ventilation, respiratory function stable and patient connected to nasal cannula oxygen Cardiovascular status: blood pressure returned to baseline and stable Postop Assessment: no apparent nausea or vomiting Anesthetic complications: no    Last Vitals:  Vitals:   05/28/18 2345 05/29/18 0004  BP: (!) 158/96 (!) 157/99  Pulse:  68  Resp:    Temp: 37 C 36.9 C  SpO2:  100%    Last Pain:  Vitals:   05/29/18 0004  TempSrc: Oral  PainSc:                  Sanvi Ehler COKER
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
## 2018-05-29 NOTE — Plan of Care (Signed)
  Problem: Education: Goal: Knowledge of General Education information will improve Outcome: Progressing   

## 2018-05-29 NOTE — Progress Notes (Addendum)
   Subjective:  Patient reports pain as mild.  No specific complaints this morning.  He is up with therapy.  He did well overnight.  He denies nausea or vomiting.  Denies chest pain or shortness of breath.  Objective:   VITALS:   Vitals:   05/28/18 2330 05/28/18 2345 05/29/18 0004 05/29/18 0535  BP: (!) 161/93 (!) 158/96 (!) 157/99 (!) 156/98  Pulse: 87  68 100  Resp: 10   17  Temp:  98.6 F (37 C) 98.4 F (36.9 C) 98.1 F (36.7 C)  TempSrc:   Oral Oral  SpO2: 97%  100% 100%  Weight:      Height:        Neurologically intact Neurovascular intact Sensation intact distally Dorsiflexion/Plantar flexion intact Compartment soft Cam boot in place.   Lab Results  Component Value Date   WBC 10.4 05/29/2018   HGB 14.1 05/29/2018   HCT 41.6 05/29/2018   MCV 86.5 05/29/2018   PLT 233 05/29/2018   BMET    Component Value Date/Time   NA 136 04/16/2015 1847   K 3.8 04/16/2015 1847   CL 103 04/16/2015 1847   CO2 24 04/16/2015 1847   GLUCOSE 113 (H) 04/16/2015 1847   BUN 13 04/16/2015 1847   CREATININE 1.13 05/29/2018 0429   CALCIUM 9.2 04/16/2015 1847   GFRNONAA >60 05/29/2018 0429   GFRAA >60 05/29/2018 0429     Assessment/Plan: 1 Day Post-Op   Principal Problem:   Closed right tibial fracture Active Problems:   Closed fracture of posterior malleolus of right tibia   Advance diet Up with therapy -Patient is doing very well this morning and would like to go home.  He feels like he needs to be home to get back to his family. 0-foot touch weightbearing to right lower extremity. -Home on twice daily aspirin for 6 weeks for DVT prophylaxis.  Follow-up in 2 weeks with Aundria Rudogers.   Yolonda KidaJason Patrick Rogers 05/29/2018, 1:37 PM   Maryan RuedJason P Rogers, MD [phone]

## 2018-05-30 ENCOUNTER — Encounter (HOSPITAL_COMMUNITY): Payer: Self-pay | Admitting: Orthopedic Surgery
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
## 2018-12-26 ENCOUNTER — Encounter (HOSPITAL_COMMUNITY): Payer: Self-pay | Admitting: *Deleted

## 2018-12-26 ENCOUNTER — Other Ambulatory Visit: Payer: Self-pay

## 2019-01-05 ENCOUNTER — Ambulatory Visit (HOSPITAL_COMMUNITY): Payer: BLUE CROSS/BLUE SHIELD | Admitting: Anesthesiology

## 2019-01-05 ENCOUNTER — Ambulatory Visit (HOSPITAL_COMMUNITY)
Admission: RE | Admit: 2019-01-05 | Discharge: 2019-01-05 | Disposition: A | Discharge status: Home / Self Care | Payer: BLUE CROSS/BLUE SHIELD | Attending: Orthopedic Surgery | Admitting: Orthopedic Surgery

## 2019-01-05 ENCOUNTER — Encounter (HOSPITAL_COMMUNITY)
Admission: RE | Disposition: A | Discharge status: Home / Self Care | Payer: Self-pay | Source: Home / Self Care | Attending: Orthopedic Surgery

## 2019-01-05 ENCOUNTER — Encounter (HOSPITAL_COMMUNITY): Payer: Self-pay | Admitting: Emergency Medicine

## 2019-01-05 DIAGNOSIS — T8484XA Pain due to internal orthopedic prosthetic devices, implants and grafts, initial encounter: Secondary | ICD-10-CM | POA: Diagnosis present

## 2019-01-05 DIAGNOSIS — K219 Gastro-esophageal reflux disease without esophagitis: Secondary | ICD-10-CM | POA: Diagnosis not present

## 2019-01-05 DIAGNOSIS — Z79899 Other long term (current) drug therapy: Secondary | ICD-10-CM | POA: Insufficient documentation

## 2019-01-05 DIAGNOSIS — F319 Bipolar disorder, unspecified: Secondary | ICD-10-CM | POA: Insufficient documentation

## 2019-01-05 DIAGNOSIS — Z87891 Personal history of nicotine dependence: Secondary | ICD-10-CM | POA: Insufficient documentation

## 2019-01-05 DIAGNOSIS — R51 Headache: Secondary | ICD-10-CM | POA: Diagnosis not present

## 2019-01-05 DIAGNOSIS — Y831 Surgical operation with implant of artificial internal device as the cause of abnormal reaction of the patient, or of later complication, without mention of misadventure at the time of the procedure: Secondary | ICD-10-CM | POA: Insufficient documentation

## 2019-01-05 DIAGNOSIS — F419 Anxiety disorder, unspecified: Secondary | ICD-10-CM | POA: Insufficient documentation

## 2019-01-05 DIAGNOSIS — Z6830 Body mass index (BMI) 30.0-30.9, adult: Secondary | ICD-10-CM | POA: Diagnosis not present

## 2019-01-05 DIAGNOSIS — Z791 Long term (current) use of non-steroidal anti-inflammatories (NSAID): Secondary | ICD-10-CM | POA: Insufficient documentation

## 2019-01-05 DIAGNOSIS — Z87442 Personal history of urinary calculi: Secondary | ICD-10-CM | POA: Diagnosis not present

## 2019-01-05 DIAGNOSIS — Z472 Encounter for removal of internal fixation device: Secondary | ICD-10-CM | POA: Diagnosis present

## 2019-01-05 DIAGNOSIS — E669 Obesity, unspecified: Secondary | ICD-10-CM | POA: Diagnosis not present

## 2019-01-05 HISTORY — DX: Anxiety disorder, unspecified: F41.9

## 2019-01-05 HISTORY — DX: Major depressive disorder, single episode, unspecified: F32.9

## 2019-01-05 HISTORY — DX: Bipolar disorder, unspecified: F31.9

## 2019-01-05 HISTORY — DX: Depression, unspecified: F32.A

## 2019-01-05 HISTORY — DX: Personal history of urinary calculi: Z87.442

## 2019-01-05 HISTORY — PX: HARDWARE REMOVAL: SHX979

## 2019-01-05 HISTORY — DX: Headache, unspecified: R51.9

## 2019-01-05 HISTORY — DX: Headache: R51

## 2019-01-05 LAB — CBC
HCT: 44.5 % (ref 39.0–52.0)
Hemoglobin: 14.9 g/dL (ref 13.0–17.0)
MCH: 29.3 pg (ref 26.0–34.0)
MCHC: 33.5 g/dL (ref 30.0–36.0)
MCV: 87.4 fL (ref 80.0–100.0)
NRBC: 0 % (ref 0.0–0.2)
PLATELETS: 254 10*3/uL (ref 150–400)
RBC: 5.09 MIL/uL (ref 4.22–5.81)
RDW: 12 % (ref 11.5–15.5)
WBC: 6.9 10*3/uL (ref 4.0–10.5)

## 2019-01-05 SURGERY — REMOVAL, HARDWARE
Anesthesia: General | Site: Knee | Laterality: Right

## 2019-01-05 MED ORDER — DEXAMETHASONE SODIUM PHOSPHATE 10 MG/ML IJ SOLN
INTRAMUSCULAR | Status: DC | PRN
  Administered 2019-01-05: 10 mg via INTRAVENOUS

## 2019-01-05 MED ORDER — LACTATED RINGERS IV SOLN
INTRAVENOUS | Status: DC
  Administered 2019-01-05: 13:00:00 via INTRAVENOUS

## 2019-01-05 MED ORDER — FENTANYL CITRATE (PF) 100 MCG/2ML IJ SOLN
INTRAMUSCULAR | Status: AC
  Filled 2019-01-05: qty 2

## 2019-01-05 MED ORDER — CEFAZOLIN SODIUM-DEXTROSE 2-4 GM/100ML-% IV SOLN
2.0000 g | INTRAVENOUS | Status: AC
  Administered 2019-01-05: 2 g via INTRAVENOUS
  Filled 2019-01-05: qty 100

## 2019-01-05 MED ORDER — PROPOFOL 10 MG/ML IV BOLUS
INTRAVENOUS | Status: DC | PRN
  Administered 2019-01-05: 20 mg via INTRAVENOUS
  Administered 2019-01-05: 180 mg via INTRAVENOUS

## 2019-01-05 MED ORDER — BUPIVACAINE-EPINEPHRINE (PF) 0.5% -1:200000 IJ SOLN
INTRAMUSCULAR | Status: DC | PRN
  Administered 2019-01-05: 10 mL

## 2019-01-05 MED ORDER — ONDANSETRON HCL 4 MG/2ML IJ SOLN
INTRAMUSCULAR | Status: AC
  Filled 2019-01-05: qty 2

## 2019-01-05 MED ORDER — DEXAMETHASONE SODIUM PHOSPHATE 10 MG/ML IJ SOLN
INTRAMUSCULAR | Status: AC
  Filled 2019-01-05: qty 1

## 2019-01-05 MED ORDER — FENTANYL CITRATE (PF) 100 MCG/2ML IJ SOLN
INTRAMUSCULAR | Status: DC | PRN
  Administered 2019-01-05 (×4): 25 ug via INTRAVENOUS

## 2019-01-05 MED ORDER — LIDOCAINE 2% (20 MG/ML) 5 ML SYRINGE
INTRAMUSCULAR | Status: DC | PRN
  Administered 2019-01-05: 50 mg via INTRAVENOUS

## 2019-01-05 MED ORDER — PROMETHAZINE HCL 25 MG/ML IJ SOLN: 6.2500 mg | INTRAMUSCULAR | Status: DC | PRN

## 2019-01-05 MED ORDER — ONDANSETRON 4 MG PO TBDP: 4.0000 mg | ORAL_TABLET | Freq: Three times a day (TID) | ORAL | 0 refills | Status: AC | PRN

## 2019-01-05 MED ORDER — MIDAZOLAM HCL 2 MG/2ML IJ SOLN: 0.5000 mg | Freq: Once | INTRAMUSCULAR | Status: DC | PRN

## 2019-01-05 MED ORDER — HYDROMORPHONE HCL 1 MG/ML IJ SOLN
0.2500 mg | INTRAMUSCULAR | Status: DC | PRN
  Administered 2019-01-05 (×2): 0.5 mg via INTRAVENOUS

## 2019-01-05 MED ORDER — MEPERIDINE HCL 50 MG/ML IJ SOLN: 6.2500 mg | INTRAMUSCULAR | Status: DC | PRN

## 2019-01-05 MED ORDER — HYDROMORPHONE HCL 1 MG/ML IJ SOLN
INTRAMUSCULAR | Status: AC
  Filled 2019-01-05: qty 1

## 2019-01-05 MED ORDER — PROPOFOL 10 MG/ML IV BOLUS
INTRAVENOUS | Status: AC
  Filled 2019-01-05: qty 20

## 2019-01-05 MED ORDER — CHLORHEXIDINE GLUCONATE 4 % EX LIQD: 60.0000 mL | Freq: Once | CUTANEOUS | Status: DC

## 2019-01-05 MED ORDER — BUPIVACAINE-EPINEPHRINE (PF) 0.5% -1:200000 IJ SOLN
INTRAMUSCULAR | Status: AC
  Filled 2019-01-05: qty 30

## 2019-01-05 MED ORDER — HYDROCODONE-ACETAMINOPHEN 5-325 MG PO TABS: 1.0000 | ORAL_TABLET | Freq: Four times a day (QID) | ORAL | 0 refills | Status: AC | PRN

## 2019-01-05 MED ORDER — ONDANSETRON HCL 4 MG/2ML IJ SOLN
INTRAMUSCULAR | Status: DC | PRN
  Administered 2019-01-05: 4 mg via INTRAVENOUS

## 2019-01-05 SURGICAL SUPPLY — 52 items
BAG SPEC THK2 15X12 ZIP CLS (MISCELLANEOUS)
BAG ZIPLOCK 12X15 (MISCELLANEOUS) ×1 IMPLANT
BANDAGE ACE 6X5 VEL STRL LF (GAUZE/BANDAGES/DRESSINGS) ×3 IMPLANT
BANDAGE ESMARK 6X9 LF (GAUZE/BANDAGES/DRESSINGS) ×1 IMPLANT
BNDG CMPR 9X6 STRL LF SNTH (GAUZE/BANDAGES/DRESSINGS)
BNDG ESMARK 6X9 LF (GAUZE/BANDAGES/DRESSINGS)
CLOSURE WOUND 1/2 X4 (GAUZE/BANDAGES/DRESSINGS)
COVER MAYO STAND STRL (DRAPES) IMPLANT
COVER SURGICAL LIGHT HANDLE (MISCELLANEOUS) ×3 IMPLANT
COVER WAND RF STERILE (DRAPES) IMPLANT
CUFF TOURN SGL QUICK 34 (TOURNIQUET CUFF) ×3
CUFF TRNQT CYL 34X4X40X1 (TOURNIQUET CUFF) ×1 IMPLANT
DRAPE ARTHROSCOPY W/POUCH 114 (DRAPES) ×3 IMPLANT
DRAPE C-ARM 42X120 X-RAY (DRAPES) IMPLANT
DRAPE EXTREMITY T 121X128X90 (DISPOSABLE) IMPLANT
DRAPE OEC MINIVIEW 54X84 (DRAPES) IMPLANT
DRAPE POUCH INSTRU U-SHP 10X18 (DRAPES) ×1 IMPLANT
DRAPE STERI IOBAN 125X83 (DRAPES) ×1 IMPLANT
DRAPE U-SHAPE 47X51 STRL (DRAPES) ×3 IMPLANT
DRSG EMULSION OIL 3X16 NADH (GAUZE/BANDAGES/DRESSINGS) IMPLANT
DRSG PAD ABDOMINAL 8X10 ST (GAUZE/BANDAGES/DRESSINGS) ×1 IMPLANT
DURAPREP 26ML APPLICATOR (WOUND CARE) ×3 IMPLANT
ELECT REM PT RETURN 15FT ADLT (MISCELLANEOUS) ×3 IMPLANT
GAUZE SPONGE 4X4 12PLY STRL (GAUZE/BANDAGES/DRESSINGS) ×3 IMPLANT
GLOVE BIOGEL M 7.0 STRL (GLOVE) IMPLANT
GLOVE BIOGEL PI IND STRL 7.5 (GLOVE) ×1 IMPLANT
GLOVE BIOGEL PI IND STRL 8.5 (GLOVE) IMPLANT
GLOVE BIOGEL PI INDICATOR 7.5 (GLOVE) ×2
GLOVE BIOGEL PI INDICATOR 8.5 (GLOVE)
GLOVE ECLIPSE 8.0 STRL XLNG CF (GLOVE) ×3 IMPLANT
GLOVE ORTHO TXT STRL SZ7.5 (GLOVE) ×2 IMPLANT
GLOVE SURG ORTHO 8.0 STRL STRW (GLOVE) ×1 IMPLANT
GOWN STRL REUS W/TWL LRG LVL3 (GOWN DISPOSABLE) ×3 IMPLANT
GOWN STRL REUS W/TWL XL LVL3 (GOWN DISPOSABLE) ×4 IMPLANT
KIT BASIN OR (CUSTOM PROCEDURE TRAY) ×1 IMPLANT
MANIFOLD NEPTUNE II (INSTRUMENTS) ×3 IMPLANT
NS IRRIG 1000ML POUR BTL (IV SOLUTION) ×3 IMPLANT
PACK TOTAL JOINT (CUSTOM PROCEDURE TRAY) IMPLANT
PADDING CAST COTTON 6X4 STRL (CAST SUPPLIES) ×3 IMPLANT
PENCIL HANDSWITCHING (ELECTRODE) ×2 IMPLANT
PROTECTOR NERVE ULNAR (MISCELLANEOUS) ×1 IMPLANT
STAPLER VISISTAT 35W (STAPLE) IMPLANT
STRIP CLOSURE SKIN 1/2X4 (GAUZE/BANDAGES/DRESSINGS) ×2 IMPLANT
SUT MNCRL AB 3-0 PS2 18 (SUTURE) ×2 IMPLANT
SUT MNCRL AB 4-0 PS2 18 (SUTURE) IMPLANT
SUT MON AB 2-0 CT1 36 (SUTURE) ×2 IMPLANT
SUT VIC AB 1 CT1 36 (SUTURE) ×3 IMPLANT
SUT VIC AB 2-0 CT1 27 (SUTURE)
SUT VIC AB 2-0 CT1 TAPERPNT 27 (SUTURE) ×1 IMPLANT
TOWEL OR 17X26 10 PK STRL BLUE (TOWEL DISPOSABLE) IMPLANT
WATER STERILE IRR 1000ML POUR (IV SOLUTION) ×1 IMPLANT
YANKAUER SUCT BULB TIP NO VENT (SUCTIONS) ×3 IMPLANT

## 2019-01-05 NOTE — Brief Op Note (Signed)
01/05/2019  2:51 PM  PATIENT:  Tyler Robertson  33 y.o. male  PRE-OPERATIVE DIAGNOSIS:  Painful hardware right leg  POST-OPERATIVE DIAGNOSIS:  Painful hardware right leg  PROCEDURE:  Procedure(s) with comments: Removal burred implant right leg (Right) - 45 mins  SURGEON:  Surgeon(s) and Role:    * Aundria Rudogers, Noah DelaineJason Patrick, MD - Primary  PHYSICIAN ASSISTANT:   ASSISTANTS: none   ANESTHESIA:   general  EBL:  5 mL   BLOOD ADMINISTERED:none  DRAINS: none   LOCAL MEDICATIONS USED:  MARCAINE     SPECIMEN:  No Specimen  DISPOSITION OF SPECIMEN:  N/A  COUNTS:  YES  TOURNIQUET:   Total Tourniquet Time Documented: Thigh (laterality) - 13 minutes Total: Thigh (laterality) - 13 minutes   DICTATION: .Note written in EPIC  PLAN OF CARE: Discharge to home after PACU  PATIENT DISPOSITION:  PACU - hemodynamically stable.   Delay start of Pharmacological VTE agent (>24hrs) due to surgical blood loss or risk of bleeding: not applicable

## 2019-01-05 NOTE — Anesthesia Preprocedure Evaluation (Addendum)
Anesthesia Evaluation  Patient identified by MRN, date of birth, ID band Patient awake    Reviewed: Allergy & Precautions, NPO status , Patient's Chart, lab work & pertinent test results  History of Anesthesia Complications Negative for: history of anesthetic complications  Airway Mallampati: I  TM Distance: >3 FB Neck ROM: Full    Dental  (+) Poor Dentition, Missing, Dental Advisory Given   Pulmonary former smoker,    breath sounds clear to auscultation       Cardiovascular negative cardio ROS   Rhythm:Regular Rate:Normal     Neuro/Psych Anxiety Depression Bipolar Disorder negative neurological ROS     GI/Hepatic Neg liver ROS, GERD  Medicated and Controlled,  Endo/Other  negative endocrine ROSobese  Renal/GU negative Renal ROSH/o stones     Musculoskeletal   Abdominal (+) + obese,   Peds  Hematology   Anesthesia Other Findings   Reproductive/Obstetrics                            Anesthesia Physical Anesthesia Plan  ASA: II  Anesthesia Plan: General   Post-op Pain Management:    Induction: Intravenous  PONV Risk Score and Plan: 3 and Ondansetron and Dexamethasone  Airway Management Planned: LMA  Additional Equipment:   Intra-op Plan:   Post-operative Plan:   Informed Consent: I have reviewed the patients History and Physical, chart, labs and discussed the procedure including the risks, benefits and alternatives for the proposed anesthesia with the patient or authorized representative who has indicated his/her understanding and acceptance.     Dental advisory given  Plan Discussed with: CRNA and Surgeon  Anesthesia Plan Comments: (Plan routine monitors, GA- LMA oK)        Anesthesia Quick Evaluation
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
## 2019-01-05 NOTE — Anesthesia Procedure Notes (Signed)
Procedure Name: LMA Insertion Date/Time: 01/05/2019 2:14 PM Performed by: Paris Lore, CRNA Pre-anesthesia Checklist: Patient identified, Emergency Drugs available, Suction available, Patient being monitored and Timeout performed Patient Re-evaluated:Patient Re-evaluated prior to induction Oxygen Delivery Method: Circle system utilized Preoxygenation: Pre-oxygenation with 100% oxygen Induction Type: IV induction Ventilation: Mask ventilation without difficulty LMA: LMA inserted LMA Size: 4.0 Number of attempts: 1 Placement Confirmation: positive ETCO2 and breath sounds checked- equal and bilateral Tube secured with: Tape

## 2019-01-05 NOTE — Anesthesia Postprocedure Evaluation (Signed)
Anesthesia Post Note  Patient: Tyler Robertson  Procedure(s) Performed: Removal burred implant right leg (Right Knee)     Patient location during evaluation: PACU Anesthesia Type: General Level of consciousness: awake and alert, patient cooperative and oriented Pain management: pain level controlled Vital Signs Assessment: post-procedure vital signs reviewed and stable Respiratory status: spontaneous breathing, nonlabored ventilation and respiratory function stable Cardiovascular status: blood pressure returned to baseline and stable Postop Assessment: no apparent nausea or vomiting Anesthetic complications: no    Last Vitals:  Vitals:   01/05/19 1545 01/05/19 1551  BP: 133/88 (!) 152/102  Pulse: 60 63  Resp: 15 15  Temp: 36.4 C 36.6 C  SpO2: 97% 98%    Last Pain:  Vitals:   01/05/19 1551  TempSrc:   PainSc: 1                  Torrey Ballinas,E. Obed Samek
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
## 2019-01-05 NOTE — Discharge Instructions (Signed)
Discharge instructions: -Okay for full weightbearing as tolerated to the right lower extremity. -Maintain postoperative bandage for 3 days.  You may remove at that time and begin showering.  Cover the incision with Band-Aid and keep it clean and dry until your follow-up appointment in 2 weeks with Dr. Aundria Rud. -For mild to moderate pain use ice, Tylenol, and Advil.  For any breakthrough pain use Norco as directed.

## 2019-01-05 NOTE — Transfer of Care (Signed)
Immediate Anesthesia Transfer of Care Note  Patient: Tyler Robertson  Procedure(s) Performed: Procedure(s) with comments: Removal burred implant right leg (Right) - 45 mins  Patient Location: PACU  Anesthesia Type:General  Level of Consciousness:  sedated, patient cooperative and responds to stimulation  Airway & Oxygen Therapy:Patient Spontanous Breathing and Patient connected to face mask oxgen  Post-op Assessment:  Report given to PACU RN and Post -op Vital signs reviewed and stable  Post vital signs:  Reviewed and stable  Last Vitals:  Vitals:   01/05/19 1302  BP: (!) 130/94  Pulse: 66  Resp: 17  Temp: 36.5 C  SpO2: 100%    Complications: No apparent anesthesia complications

## 2019-01-06 ENCOUNTER — Encounter (HOSPITAL_COMMUNITY): Payer: Self-pay | Admitting: Orthopedic Surgery

## 2019-05-14 ENCOUNTER — Encounter (HOSPITAL_COMMUNITY): Payer: Self-pay | Admitting: Emergency Medicine

## 2019-05-14 ENCOUNTER — Other Ambulatory Visit: Payer: Self-pay

## 2019-05-14 ENCOUNTER — Emergency Department (HOSPITAL_COMMUNITY)
Admission: EM | Admit: 2019-05-14 | Discharge: 2019-05-14 | Disposition: A | Discharge status: Home / Self Care | Payer: 59 | Attending: Emergency Medicine | Admitting: Emergency Medicine

## 2019-05-14 ENCOUNTER — Emergency Department (HOSPITAL_COMMUNITY): Payer: 59

## 2019-05-14 DIAGNOSIS — R55 Syncope and collapse: Secondary | ICD-10-CM | POA: Diagnosis present

## 2019-05-14 DIAGNOSIS — E876 Hypokalemia: Secondary | ICD-10-CM | POA: Diagnosis not present

## 2019-05-14 DIAGNOSIS — Z79899 Other long term (current) drug therapy: Secondary | ICD-10-CM | POA: Insufficient documentation

## 2019-05-14 DIAGNOSIS — Z87891 Personal history of nicotine dependence: Secondary | ICD-10-CM | POA: Diagnosis not present

## 2019-05-14 DIAGNOSIS — F319 Bipolar disorder, unspecified: Secondary | ICD-10-CM | POA: Insufficient documentation

## 2019-05-14 LAB — BASIC METABOLIC PANEL
Anion gap: 12 (ref 5–15)
BUN: 5 mg/dL — ABNORMAL LOW (ref 6–20)
CO2: 22 mmol/L (ref 22–32)
Calcium: 9.4 mg/dL (ref 8.9–10.3)
Chloride: 103 mmol/L (ref 98–111)
Creatinine, Ser: 1.1 mg/dL (ref 0.61–1.24)
GFR calc Af Amer: >60 mL/min (ref >60)
GFR calc non Af Amer: >60 mL/min (ref >60)
Glucose, Bld: 112 mg/dL — ABNORMAL HIGH (ref 70–99)
Potassium: 3.3 mmol/L — ABNORMAL LOW (ref 3.5–5.1)
Sodium: 137 mmol/L (ref 135–145)

## 2019-05-14 LAB — CBC
HCT: 43.3 % (ref 39.0–52.0)
Hemoglobin: 14.8 g/dL (ref 13.0–17.0)
MCH: 28.8 pg (ref 26.0–34.0)
MCHC: 34.2 g/dL (ref 30.0–36.0)
MCV: 84.4 fL (ref 80.0–100.0)
Platelets: 259 10*3/uL (ref 150–400)
RBC: 5.13 MIL/uL (ref 4.22–5.81)
RDW: 11.9 % (ref 11.5–15.5)
WBC: 11.2 10*3/uL — ABNORMAL HIGH (ref 4.0–10.5)
nRBC: 0 % (ref 0.0–0.2)

## 2019-05-14 LAB — CBG MONITORING, ED: Glucose-Capillary: 84 mg/dL (ref 70–99)

## 2019-05-14 MED ORDER — SODIUM CHLORIDE 0.9% FLUSH
3.0000 mL | Freq: Once | INTRAVENOUS | Status: AC
  Administered 2019-05-14: 3 mL via INTRAVENOUS

## 2019-05-14 MED ORDER — SODIUM CHLORIDE 0.9 % IV BOLUS
1000.0000 mL | Freq: Once | INTRAVENOUS | Status: AC
  Administered 2019-05-14: 1000 mL via INTRAVENOUS

## 2019-05-14 MED ORDER — POTASSIUM CHLORIDE CRYS ER 20 MEQ PO TBCR
40.0000 meq | EXTENDED_RELEASE_TABLET | Freq: Once | ORAL | Status: AC
  Administered 2019-05-14: 12:00:00 40 meq via ORAL
  Filled 2019-05-14: qty 2

## 2019-05-14 NOTE — ED Provider Notes (Signed)
[place] EMERGENCY DEPARTMENT Provider Note   CSN: [id number] Arrival date & time: 05/14/19  7564    History   Chief Complaint Chief Complaint  Patient presents with  . Near Syncope  . Chest Pain    HPI Tyler Robertson is a 33 y.o. male.     HPI  33 year old male presents with near syncope.  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  This has occurred numerous times in the past, usually about 2 or 3 times a month.  He typically only gets tingling and numbness in his hands and arms but today he also got them in his feet.  He also felt a little more short of breath than typical.  He did not have chest pain but had a "thumping" in his chest.  All of that has resolved.  Still has some intermittent tingling.  No headache or focal weakness.  He did not actually pass out.  He states he drank a cup of coffee and a little bit of Dr. Malachi Bonds but nothing to eat or drink otherwise.  However he states this is not atypical for him. No lower extremity edema or pain, or history of DVT.  Past Medical History:  Diagnosis Date  . Anxiety   . Bipolar 1 disorder ([place])    per patient  . Depression   . Headache   . History of kidney stones     Patient Active Problem List   Diagnosis Date Noted  . Painful orthopaedic hardware ([place]) 01/05/2019  . Closed right tibial fracture 05/28/2018  . Closed fracture of posterior malleolus of right tibia 05/28/2018    Past Surgical History:  Procedure Laterality Date  . FRACTURE SURGERY    . HARDWARE REMOVAL Right 01/05/2019   Procedure: Removal burred implant right leg;  Surgeon: Nicholes Stairs, MD;  Location: [facility];  Service: Orthopedics;  Laterality: Right;  45 mins  . right arm fracture     x 6  . TIBIA IM NAIL INSERTION Right 05/28/2018   Procedure: INTRAMEDULLARY (IM) NAIL TIBIAL;  Surgeon: Nicholes Stairs, MD;  Location: [place];  Service: Orthopedics;  Laterality: Right;  .  WISDOM TOOTH EXTRACTION          Home Medications    Prior to Admission medications   Medication Sig Start Date End Date Taking? Authorizing Provider  cetirizine (ZYRTEC) 10 MG tablet Take 10 mg by mouth daily. 05/18/18   [provider]  escitalopram (LEXAPRO) 20 MG tablet Take 20 mg by mouth daily. 05/05/18   [provider]  ibuprofen (ADVIL,MOTRIN) 800 MG tablet Take 800 mg by mouth every 8 (eight) hours as needed for headache or moderate pain.    [provider]  ondansetron (ZOFRAN ODT) 4 MG disintegrating tablet Take 1 tablet (4 mg total) by mouth every 8 (eight) hours as needed for nausea or vomiting. 01/05/19   Nicholes Stairs, MD  Pseudoeph-Doxylamine-DM-APAP (NYQUIL PO) Take 1 Dose by mouth at bedtime as needed (sleep).    [provider]  ranitidine (ZANTAC) 150 MG tablet Take 150 mg by mouth daily as needed for heartburn.    [provider]    Family History No family history on file.  Social History Social History   Tobacco Use  . Smoking status: Former Smoker    Types: Cigarettes  . Smokeless tobacco: Current User    Types: Chew  Substance Use Topics  .  Alcohol use: No  . Drug use: No     Allergies   Patient has no known allergies.   Review of Systems Review of Systems  Respiratory: Positive for shortness of breath.   Cardiovascular: Negative for chest pain.  Gastrointestinal: Positive for diarrhea (chronic). Negative for vomiting.  Neurological: Positive for light-headedness and numbness. Negative for syncope, weakness and headaches.  All other systems reviewed and are negative.    Physical Exam Updated Vital Signs BP 130/81 (BP Location: Right Arm)   Pulse (!) 54   Temp 98.6 F (37 C) (Oral)   Resp 11   Wt 89.5 kg   SpO2 99%   BMI 30.00 kg/m   Physical Exam Vitals signs and nursing note reviewed.  Constitutional:      General: He is not in acute distress.    Appearance: He is  well-developed. He is not ill-appearing or diaphoretic.  HENT:     Head: Normocephalic and atraumatic.     Right Ear: External ear normal.     Left Ear: External ear normal.     Nose: Nose normal.  Eyes:     General:        Right eye: No discharge.        Left eye: No discharge.  Neck:     Musculoskeletal: Neck supple.  Cardiovascular:     Rate and Rhythm: Normal rate and regular rhythm.     Heart sounds: Normal heart sounds.  Pulmonary:     Effort: Pulmonary effort is normal.     Breath sounds: Normal breath sounds.  Abdominal:     Palpations: Abdomen is soft.     Tenderness: There is no abdominal tenderness.  Musculoskeletal:     Right lower leg: No edema.     Left lower leg: No edema.  Skin:    General: Skin is warm and dry.  Neurological:     Mental Status: He is alert.     Comments: CN 3-12 grossly intact. 5/5 strength in all 4 extremities. Grossly normal sensation. Normal finger to nose.   Psychiatric:        Mood and Affect: Mood is not anxious.      ED Treatments / Results  Labs (all labs ordered are listed, but only abnormal results are displayed) Labs Reviewed  BASIC METABOLIC PANEL - Abnormal; Notable for the following components:      Result Value   Potassium 3.3 (*)    Glucose, Bld 112 (*)    BUN 5 (*)    All other components within normal limits  CBC - Abnormal; Notable for the following components:   WBC 11.2 (*)    All other components within normal limits  CBG MONITORING, ED    EKG EKG Interpretation  Date/Time:  Monday May 14 2019 09:57:45 EDT Ventricular Rate:  62 PR Interval:  158 QRS Duration: 98 QT Interval:  396 QTC Calculation: 401 R Axis:   73 Text Interpretation:  Normal sinus rhythm nonspecific flat T waves No old tracing to compare Confirmed by Pricilla LovelessGoldston, Jlyn Cerros [phone]([id number]) on 05/14/2019 11:40:24 AM   Radiology Dg Chest 2 View  Result Date: 05/14/2019 CLINICAL DATA:  Shortness of breath.  High blood pressure. EXAM: CHEST - 2 VIEW  COMPARISON:  February 01, 2017 FINDINGS: The heart size and mediastinal contours are within normal limits. Both lungs are clear. The visualized skeletal structures are unremarkable. IMPRESSION: No active cardiopulmonary disease. Electronically Signed   By: Gerome Samavid  Williams III M.D  On: 05/14/2019 13:10    Procedures Procedures (including critical care time)  Medications Ordered in ED Medications  sodium chloride flush (NS) 0.9 % injection 3 mL (3 mLs Intravenous Given 05/14/19 1205)  sodium chloride 0.9 % bolus 1,000 mL (0 mLs Intravenous Stopped 05/14/19 1414)  potassium chloride SA (K-DUR) CR tablet 40 mEq (40 mEq Oral Given 05/14/19 1202)     Initial Impression / Assessment and Plan / ED Course  I have reviewed the triage vital signs and the nursing notes.  Pertinent labs & imaging results that were available during my care of the patient were reviewed by me and considered in my medical decision making (see chart for details).        Patient's near syncope is a recurrent issue for him.  He did not have any true chest pain though he felt a thumping in his chest.  However no palpitations were felt.  Here he is hemodynamically stable.  He was given some IV fluids and his potassium was replaced.  Perhaps this was causing some mild paresthesias.  Otherwise, my suspicion for ACS, PE, dissection is extremely low.  I doubt an acute neurologic problem.  He has already scheduled follow-up with his doctor tomorrow and I have encouraged him to keep this.  Return precautions.  Final Clinical Impressions(s) / ED Diagnoses   Final diagnoses:  Near syncope  Hypokalemia    ED Discharge Orders    None       Pricilla LovelessGoldston, Denette Hass, MD 05/14/19 1420

## 2019-05-14 NOTE — ED Notes (Signed)
Patient Alert and oriented to baseline. Stable and ambulatory to baseline. Patient verbalized understanding of the discharge instructions.  Patient belongings were taken by the patient.   

## 2019-05-14 NOTE — ED Triage Notes (Signed)
Pt in from work via [employer] [facility] with near syncope - states this has been going on x 2 yrs. 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 Denies any LOC, CBG 108, has not eaten breakfast this am

## 2021-02-06 ENCOUNTER — Other Ambulatory Visit: Payer: Self-pay

## 2021-02-06 ENCOUNTER — Emergency Department (HOSPITAL_BASED_OUTPATIENT_CLINIC_OR_DEPARTMENT_OTHER): Payer: 59

## 2021-02-06 ENCOUNTER — Emergency Department (HOSPITAL_BASED_OUTPATIENT_CLINIC_OR_DEPARTMENT_OTHER)
Admission: EM | Admit: 2021-02-06 | Discharge: 2021-02-06 | Disposition: A | Discharge status: Home / Self Care | Payer: 59 | Attending: Emergency Medicine | Admitting: Emergency Medicine

## 2021-02-06 ENCOUNTER — Encounter (HOSPITAL_BASED_OUTPATIENT_CLINIC_OR_DEPARTMENT_OTHER): Payer: Self-pay

## 2021-02-06 DIAGNOSIS — Z87891 Personal history of nicotine dependence: Secondary | ICD-10-CM | POA: Diagnosis not present

## 2021-02-06 DIAGNOSIS — Z87442 Personal history of urinary calculi: Secondary | ICD-10-CM | POA: Insufficient documentation

## 2021-02-06 DIAGNOSIS — N202 Calculus of kidney with calculus of ureter: Secondary | ICD-10-CM | POA: Diagnosis not present

## 2021-02-06 DIAGNOSIS — N23 Unspecified renal colic: Secondary | ICD-10-CM

## 2021-02-06 DIAGNOSIS — R109 Unspecified abdominal pain: Secondary | ICD-10-CM | POA: Diagnosis present

## 2021-02-06 LAB — URINALYSIS, ROUTINE W REFLEX MICROSCOPIC
Bilirubin Urine: NEGATIVE
Glucose, UA: NEGATIVE mg/dL
Ketones, ur: NEGATIVE mg/dL
Leukocytes,Ua: NEGATIVE
Nitrite: NEGATIVE
Protein, ur: NEGATIVE mg/dL
Specific Gravity, Urine: 1.03 (ref 1.005–1.030)
pH: 6 (ref 5.0–8.0)

## 2021-02-06 LAB — CBC WITH DIFFERENTIAL/PLATELET
Abs Immature Granulocytes: 0.02 10*3/uL (ref 0.00–0.07)
Basophils Absolute: 0.1 10*3/uL (ref 0.0–0.1)
Basophils Relative: 1 %
Eosinophils Absolute: 0.2 10*3/uL (ref 0.0–0.5)
Eosinophils Relative: 2 %
HCT: 43.5 % (ref 39.0–52.0)
Hemoglobin: 14.9 g/dL (ref 13.0–17.0)
Immature Granulocytes: 0 %
Lymphocytes Relative: 32 %
Lymphs Abs: 2.6 10*3/uL (ref 0.7–4.0)
MCH: 29.5 pg (ref 26.0–34.0)
MCHC: 34.3 g/dL (ref 30.0–36.0)
MCV: 86.1 fL (ref 80.0–100.0)
Monocytes Absolute: 0.9 10*3/uL (ref 0.1–1.0)
Monocytes Relative: 10 %
Neutro Abs: 4.6 10*3/uL (ref 1.7–7.7)
Neutrophils Relative %: 55 %
Platelets: 276 10*3/uL (ref 150–400)
RBC: 5.05 MIL/uL (ref 4.22–5.81)
RDW: 11.6 % (ref 11.5–15.5)
WBC: 8.3 10*3/uL (ref 4.0–10.5)
nRBC: 0 % (ref 0.0–0.2)

## 2021-02-06 LAB — COMPREHENSIVE METABOLIC PANEL
ALT: 44 U/L (ref 0–44)
AST: 27 U/L (ref 15–41)
Albumin: 4.3 g/dL (ref 3.5–5.0)
Alkaline Phosphatase: 63 U/L (ref 38–126)
Anion gap: 10 (ref 5–15)
BUN: 16 mg/dL (ref 6–20)
CO2: 26 mmol/L (ref 22–32)
Calcium: 9.2 mg/dL (ref 8.9–10.3)
Chloride: 105 mmol/L (ref 98–111)
Creatinine, Ser: 0.92 mg/dL (ref 0.61–1.24)
GFR, Estimated: >60 mL/min (ref >60)
Glucose, Bld: 80 mg/dL (ref 70–99)
Potassium: 3.8 mmol/L (ref 3.5–5.1)
Sodium: 141 mmol/L (ref 135–145)
Total Bilirubin: 0.3 mg/dL (ref 0.3–1.2)
Total Protein: 7.8 g/dL (ref 6.5–8.1)

## 2021-02-06 LAB — URINALYSIS, MICROSCOPIC (REFLEX)

## 2021-02-06 MED ORDER — TAMSULOSIN HCL 0.4 MG PO CAPS: 0.4000 mg | ORAL_CAPSULE | Freq: Every day | ORAL | 0 refills | Status: AC

## 2021-02-06 MED ORDER — TAMSULOSIN HCL 0.4 MG PO CAPS
0.4000 mg | ORAL_CAPSULE | Freq: Once | ORAL | Status: AC
  Administered 2021-02-06: 0.4 mg via ORAL
  Filled 2021-02-06: qty 1

## 2021-02-06 MED ORDER — SODIUM CHLORIDE 0.9 % IV BOLUS
1000.0000 mL | Freq: Once | INTRAVENOUS | Status: AC
  Administered 2021-02-06: 1000 mL via INTRAVENOUS

## 2021-02-06 NOTE — ED Provider Notes (Signed)
[facility] EMERGENCY DEPARTMENT Provider Note   CSN: [id number] Arrival date & time: 02/06/21  1825     History Chief Complaint  Patient presents with  . Back Pain    Tyler Robertson is a 35 y.o. male history of kidney stone, bipolar, here presenting with right flank pain.  Patient states that Tyler Robertson has been having right flank pain for the last 2 weeks or so.  Patient states that the pain is sharp and radiates to the groin.  Tyler Robertson states that since then removed with the tip of his penis and Tyler Robertson states that Tyler Robertson was concerned that maybe the stone was stuck in his penis.  Tyler Robertson is able to urinate however.  Tyler Robertson states that Tyler Robertson has intermittent hematuria for the last several months.  Tyler Robertson told me that Tyler Robertson had kidney stones several times but never saw a urologist.  Tyler Robertson has been running subjective fevers at home but did not take his temperature.  Denies any vomiting.  Denies any sick contacts  The history is provided by the patient.       Past Medical History:  Diagnosis Date  . Anxiety   . Bipolar 1 disorder ([facility])    per patient  . Depression   . Headache   . History of kidney stones     Patient Active Problem List   Diagnosis Date Noted  . Painful orthopaedic hardware ([facility]) 01/05/2019  . Closed right tibial fracture 05/28/2018  . Closed fracture of posterior malleolus of right tibia 05/28/2018    Past Surgical History:  Procedure Laterality Date  . FRACTURE SURGERY    . HARDWARE REMOVAL Right 01/05/2019   Procedure: Removal burred implant right leg;  Surgeon: Yolonda Kida, MD;  Location: [facility];  Service: Orthopedics;  Laterality: Right;  45 mins  . right arm fracture     x 6  . TIBIA IM NAIL INSERTION Right 05/28/2018   Procedure: INTRAMEDULLARY (IM) NAIL TIBIAL;  Surgeon: Yolonda Kida, MD;  Location: [facility];  Service: Orthopedics;  Laterality: Right;  . WISDOM TOOTH EXTRACTION         No family history on file.  Social History   Tobacco Use  . Smoking  status: Former Smoker    Types: Cigarettes  . Smokeless tobacco: Current User    Types: Chew  Vaping Use  . Vaping Use: Some days  . Substances: Nicotine, Flavoring  . Devices: mode-fill tank up on cartridge  Substance Use Topics  . Alcohol use: Yes    Comment: social  . Drug use: No    Home Medications Prior to Admission medications   Medication Sig Start Date End Date Taking? Authorizing Provider  cetirizine (ZYRTEC) 10 MG tablet Take 10 mg by mouth daily. 05/18/18   [provider]  escitalopram (LEXAPRO) 20 MG tablet Take 20 mg by mouth daily. 05/05/18   [provider]  ibuprofen (ADVIL,MOTRIN) 800 MG tablet Take 800 mg by mouth every 8 (eight) hours as needed for headache or moderate pain.    [provider]  ondansetron (ZOFRAN ODT) 4 MG disintegrating tablet Take 1 tablet (4 mg total) by mouth every 8 (eight) hours as needed for nausea or vomiting. 01/05/19   Yolonda Kida, MD  Pseudoeph-Doxylamine-DM-APAP (NYQUIL PO) Take 1 Dose by mouth at bedtime as needed (sleep).    [provider]  ranitidine (ZANTAC) 150 MG tablet Take 150 mg by mouth daily as needed for heartburn.    [provider]    Allergies    Patient has no known allergies.  Review of Systems   Review of Systems  Genitourinary: Positive for dysuria, flank pain and hematuria.  All other systems reviewed and are negative.   Physical Exam Updated Vital Signs BP (!) 149/103 (BP Location: Left Arm)   Pulse 66   Temp 98.4 F (36.9 C) (Oral)   Resp 18   Ht 5\' 8"  (1.727 m)   Wt 91.2 kg   SpO2 99%   BMI 30.56 kg/m   Physical Exam Vitals and nursing note reviewed.  Constitutional:      Appearance: Normal appearance.  HENT:     Head: Normocephalic.     Nose: Nose normal.     Mouth/Throat:     Mouth: Mucous membranes are moist.  Eyes:     Extraocular Movements: Extraocular movements intact.     Pupils: Pupils are equal, round, and reactive to light.   Cardiovascular:     Rate and Rhythm: Normal rate and regular rhythm.     Pulses: Normal pulses.     Heart sounds: Normal heart sounds.  Pulmonary:     Effort: Pulmonary effort is normal.     Breath sounds: Normal breath sounds.  Abdominal:     General: Abdomen is flat.     Palpations: Abdomen is soft.     Comments: Mild right CVAT  Genitourinary:    Comments: Testicles nontender.  Small amount of whitish discharge tip of penis?  Musculoskeletal:        General: Normal range of motion.     Cervical back: Normal range of motion and neck supple.  Skin:    General: Skin is warm.     Capillary Refill: Capillary refill takes less than 2 seconds.  Neurological:     General: No focal deficit present.     Mental Status: Tyler Robertson is alert and oriented to person, place, and time.  Psychiatric:        Mood and Affect: Mood normal.        Behavior: Behavior normal.     ED Results / Procedures / Treatments   Labs (all labs ordered are listed, but only abnormal results are displayed) Labs Reviewed  URINALYSIS, ROUTINE W REFLEX MICROSCOPIC - Abnormal; Notable for the following components:      Result Value   Hgb urine dipstick TRACE (*)    All other components within normal limits  URINALYSIS, MICROSCOPIC (REFLEX) - Abnormal; Notable for the following components:   Bacteria, UA FEW (*)    All other components within normal limits  URINE CULTURE  CBC WITH DIFFERENTIAL/PLATELET  COMPREHENSIVE METABOLIC PANEL  GC/CHLAMYDIA PROBE AMP ([place]) NOT AT [facility]    EKG None  Radiology CT Renal Stone Study  Result Date: 02/06/2021 CLINICAL DATA:  Right-sided back pain, history of nephrolithiasis EXAM: CT ABDOMEN AND PELVIS WITHOUT CONTRAST TECHNIQUE: Multidetector CT imaging of the abdomen and pelvis was performed following the standard protocol without IV contrast. COMPARISON:  06/30/2020 FINDINGS: Lower chest: No acute pleural or parenchymal lung disease. Hepatobiliary: No focal liver  abnormality is seen. No gallstones, gallbladder wall thickening, or biliary dilatation. Pancreas: Unremarkable. No pancreatic ductal dilatation or surrounding inflammatory changes. Spleen: Normal in size without focal abnormality. Adrenals/Urinary Tract: There is a punctate less than 2 mm nonobstructing calculus lower pole left kidney. No right-sided calculi. No evidence of obstructive uropathy. There is a nonobstructing 3 mm distal right ureteral calculus, reference image 69/2. No left ureteral calculi.  The bladder is unremarkable.  The adrenals are normal. Stomach/Bowel: No bowel obstruction or ileus. Normal appendix right lower quadrant. No bowel wall thickening or inflammatory change. Vascular/Lymphatic: No significant vascular findings are present. No enlarged abdominal or pelvic lymph nodes. Reproductive: Prostate is unremarkable. Other: No free fluid or free gas.  No abdominal wall hernia. Musculoskeletal: No acute or destructive bony lesions. Reconstructed images demonstrate no additional findings. IMPRESSION: 1. Nonobstructing 3 mm distal right ureteral calculus. 2. Nonobstructing punctate less than 2 mm left renal calculus. 3. Otherwise unremarkable unenhanced exam.  Normal appendix. Electronically Signed   By: Sharlet Salina M.D.   On: 02/06/2021 19:15    Procedures Procedures   Medications Ordered in ED Medications  tamsulosin (FLOMAX) capsule 0.4 mg (has no administration in time range)  sodium chloride 0.9 % bolus 1,000 mL (1,000 mLs Intravenous New Bag/Given 02/06/21 1901)    ED Course  I have reviewed the triage vital signs and the nursing notes.  Pertinent labs & imaging results that were available during my care of the patient were reviewed by me and considered in my medical decision making (see chart for details).    MDM Rules/Calculators/A&P                         JAMERSON VONBARGEN is a 35 y.o. male here presenting with dysuria and hematuria and flank pain.  Likely renal colic  versus pyelonephritis versus STD.  Tyler Robertson states that Tyler Robertson is sexually active with his wife only.  Will get CBC, CMP, CT renal stone, urinalysis, GC/chlamydia   8:11 PM Labs unremarkable. UA showed some blood and white blood cells but very few bacteria and no obvious signs of UTI.  CT showed nonobstructing right distal ureteral stone and nonobstructing left renal stone.  Patient declined any pain medicine.  Patient states that Tyler Robertson has leftover hydrocodone from previous surgery.  Will start patient on Flomax and will have him follow-up with urology.   Final Clinical Impression(s) / ED Diagnoses Final diagnoses:  None    Rx / DC Orders ED Discharge Orders    None       Charlynne Pander, MD 02/06/21 2013

## 2021-02-06 NOTE — Discharge Instructions (Addendum)
You have multiple kidney stones.  You have 1 stone that is on the right side and another one in the left kidney.  Please take Flomax as prescribed.  Take Motrin 800 mg every 6 hours for pain.  Stay hydrated  Please see urology for follow-up  Return to ER if you have worse abdominal pain, flank pain, fever, vomiting.

## 2021-02-06 NOTE — ED Notes (Signed)
ED Provider at bedside. 

## 2021-02-06 NOTE — ED Triage Notes (Signed)
Pt arrives ambulatory to ED with c/o back pain to right side states that he gets frequent kidney stones and says that for the past 2 years he has had blood in urine, foamy urine, and other problems with his urination.

## 2021-02-08 LAB — URINE CULTURE: Culture: NO GROWTH

## 2021-02-09 LAB — GC/CHLAMYDIA PROBE AMP (~~LOC~~) NOT AT ARMC
Chlamydia: NEGATIVE
Comment: NEGATIVE
Comment: NORMAL
Neisseria Gonorrhea: NEGATIVE

## 2021-12-11 IMAGING — CT CT RENAL STONE PROTOCOL
2 of 4 series · 17 of 46 positions shown, 19 images · non-contrast
Comparison: 06/30/2020

CLINICAL DATA: Right-sided back pain, history of nephrolithiasis

EXAM:
CT ABDOMEN AND PELVIS WITHOUT CONTRAST
TECHNIQUE: Multidetector CT imaging of the abdomen and pelvis was performed
following the standard protocol without IV contrast.

[Series 2: axial st · axial · 0.80mm/px · z∈[-506,-110]mm · 14 of 87 slices shown, 16 images]
[im 4/87  soft-tissue]
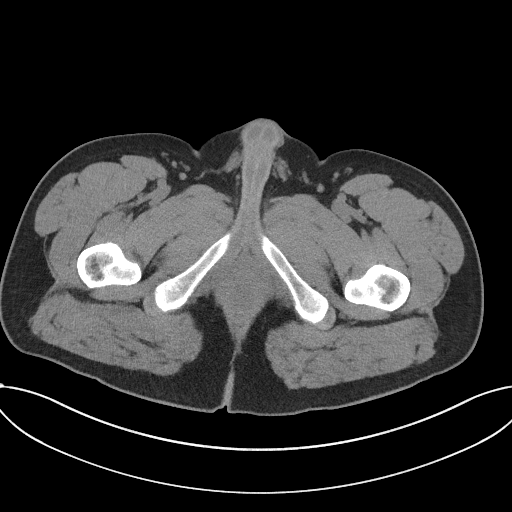
[im 4/87  bone]
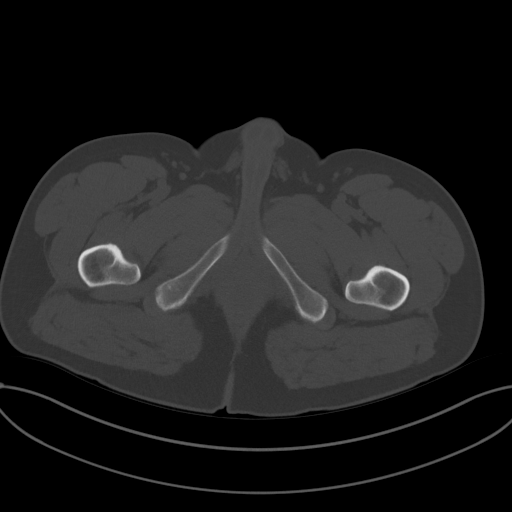
[im 10/87  soft-tissue]
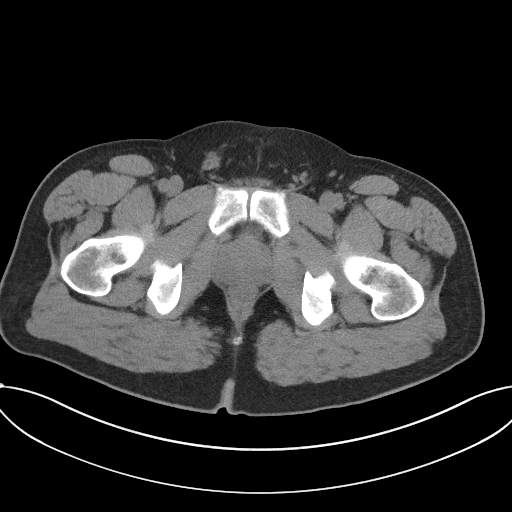
[im 16/87  soft-tissue]
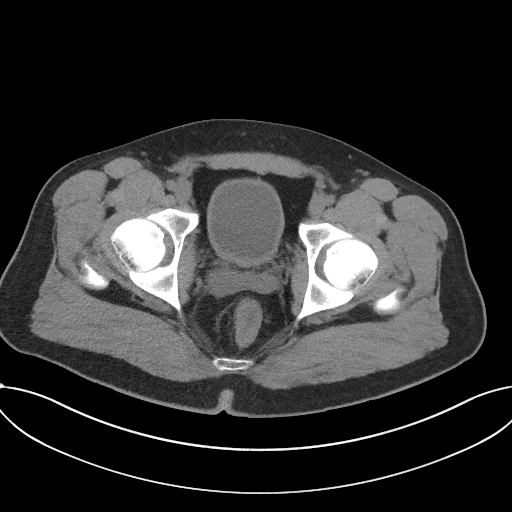
[im 23/87  soft-tissue]
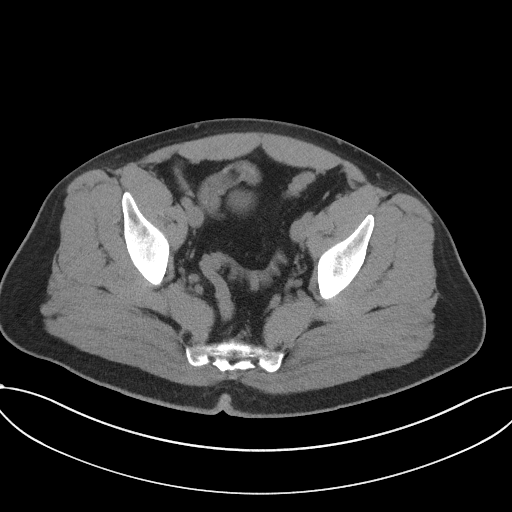
[im 29/87  soft-tissue]
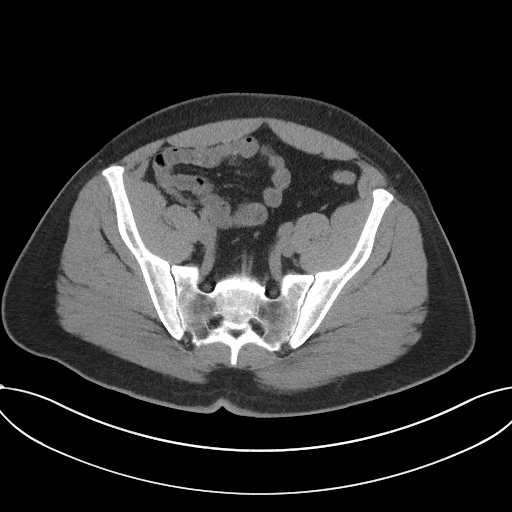
[im 36/87  soft-tissue]
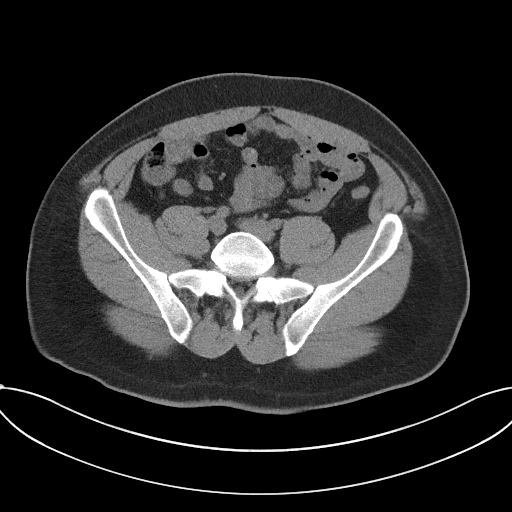
[im 42/87  soft-tissue]
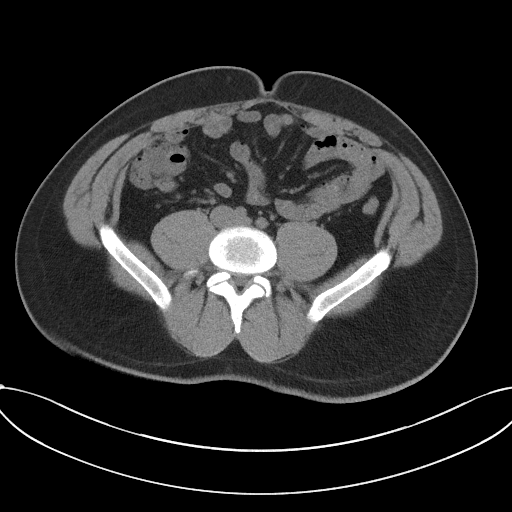
[im 45/87  soft-tissue]
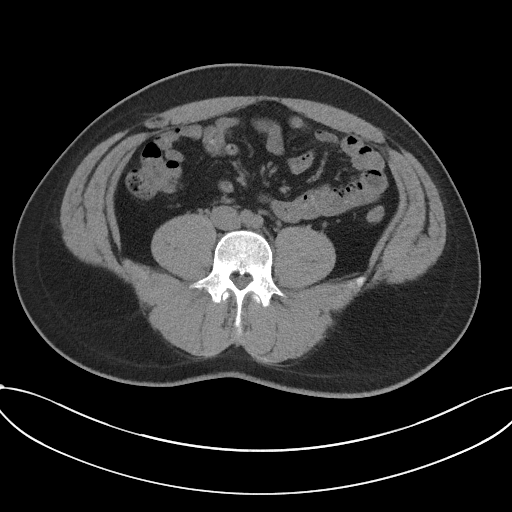
[im 51/87  soft-tissue]
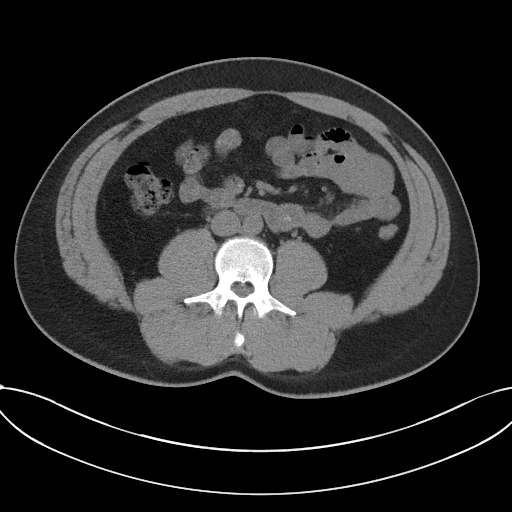
[im 51/87  bone]
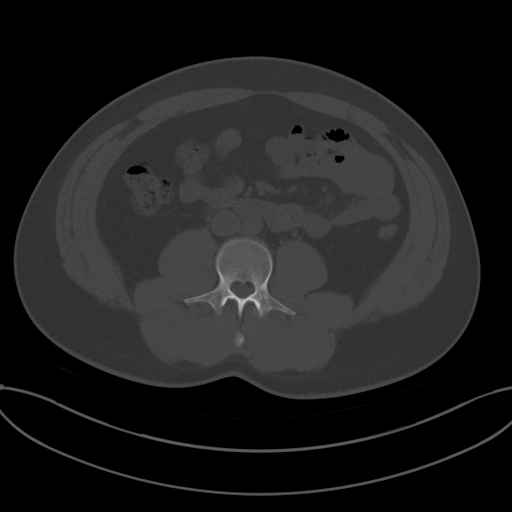
[im 58/87  soft-tissue]
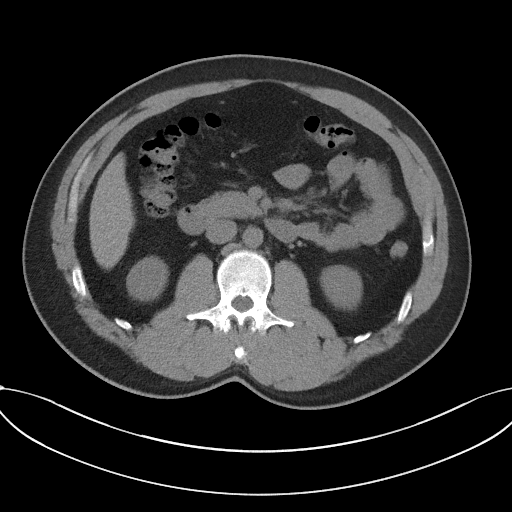
[im 64/87  soft-tissue]
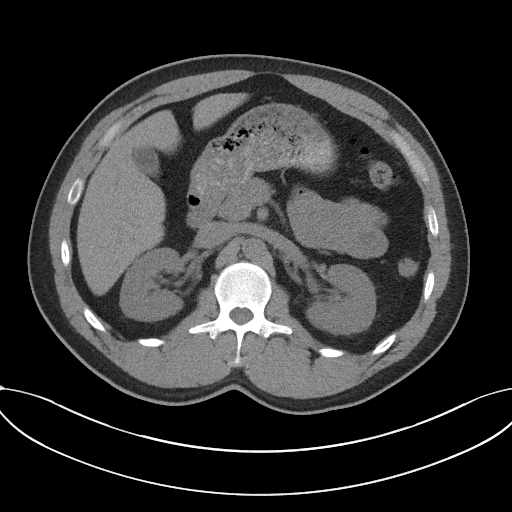
[im 71/87  soft-tissue]
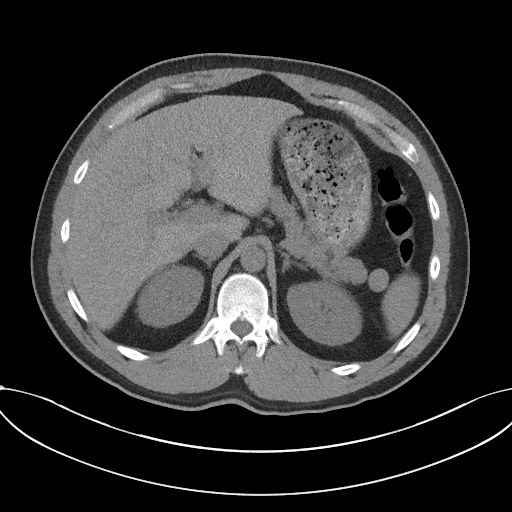
[im 77/87  soft-tissue]
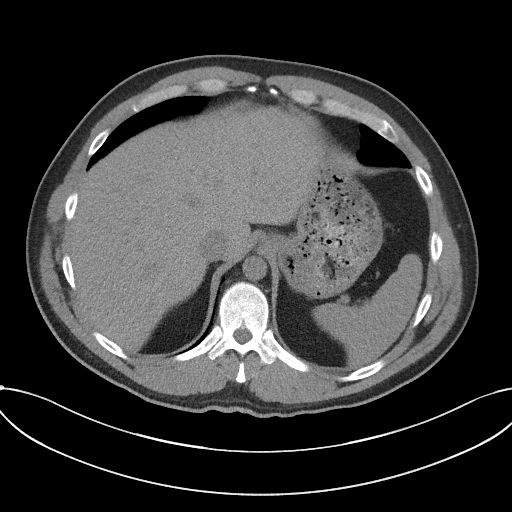
[im 83/87  soft-tissue]
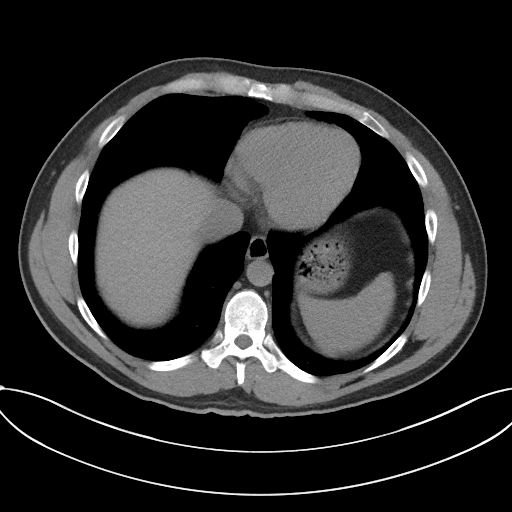

[Series 5: coronal st · coronal · 0.92mm/px · 3 of 96 slices shown]
[im 32/96  soft-tissue]
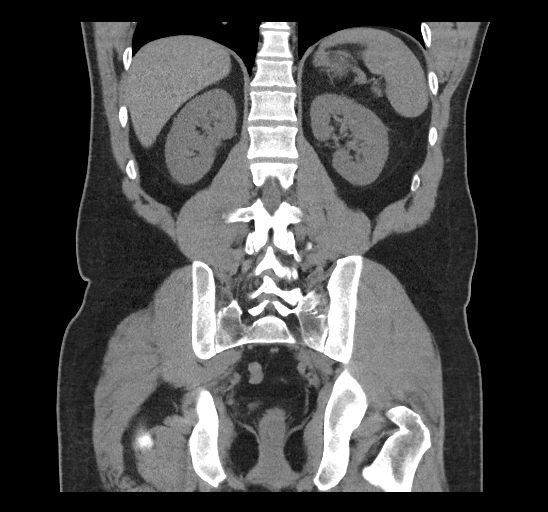
[im 43/96  soft-tissue]
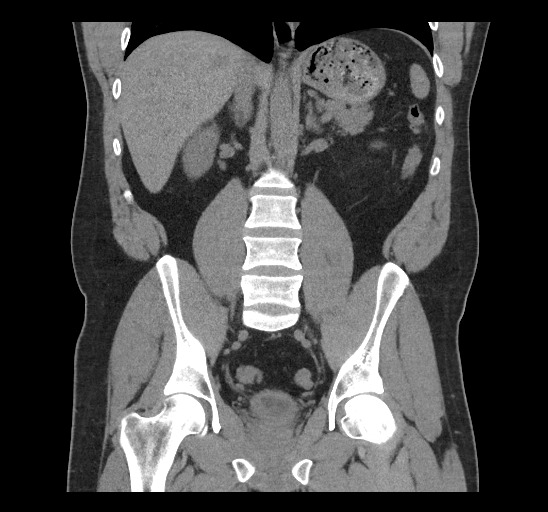
[im 53/96  soft-tissue]
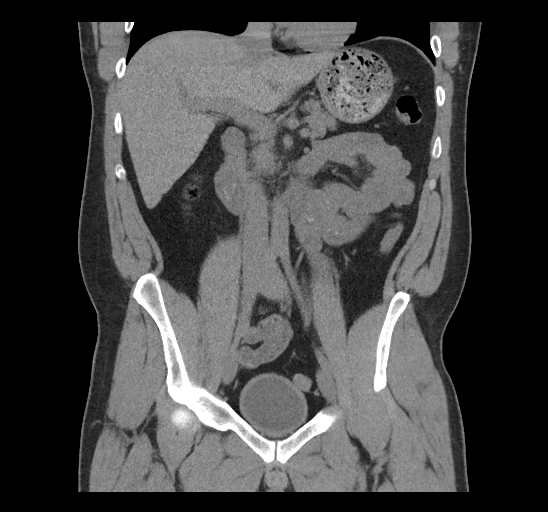

[17 of 46 positions shown; findings below may reference images not displayed]

FINDINGS: Lower chest: No acute pleural or parenchymal lung disease.

Hepatobiliary: No focal liver abnormality is seen. No gallstones,
gallbladder wall thickening, or biliary dilatation.

Pancreas: Unremarkable. No pancreatic ductal dilatation or
surrounding inflammatory changes.

Spleen: Normal in size without focal abnormality.

Adrenals/Urinary Tract: There is a punctate less than 2 mm
nonobstructing calculus lower pole left kidney. No right-sided
calculi. No evidence of obstructive uropathy.

There is a nonobstructing 3 mm distal right ureteral calculus,
reference image 69/2. No left ureteral calculi.

The bladder is unremarkable.  The adrenals are normal.

Stomach/Bowel: No bowel obstruction or ileus. Normal appendix right
lower quadrant. No bowel wall thickening or inflammatory change.

Vascular/Lymphatic: No significant vascular findings are present. No
enlarged abdominal or pelvic lymph nodes.

Reproductive: Prostate is unremarkable.

Other: No free fluid or free gas.  No abdominal wall hernia.

Musculoskeletal: No acute or destructive bony lesions. Reconstructed
images demonstrate no additional findings.
IMPRESSION: 1. Nonobstructing 3 mm distal right ureteral calculus.
2. Nonobstructing punctate less than 2 mm left renal calculus.
3. Otherwise unremarkable unenhanced exam.  Normal appendix.
